# Patient Record
Sex: Male | Born: 1980 | Race: White | Hispanic: No | Marital: Married | Smoking: Former smoker
Health system: Southern US, Community
[De-identification: ages and names within clinical notes are randomized; demographics above are authoritative.]

## PROBLEM LIST (undated history)

## (undated) DIAGNOSIS — T7840XA Allergy, unspecified, initial encounter: Secondary | ICD-10-CM

## (undated) DIAGNOSIS — I10 Essential (primary) hypertension: Secondary | ICD-10-CM

## (undated) DIAGNOSIS — R002 Palpitations: Secondary | ICD-10-CM

## (undated) HISTORY — DX: Allergy, unspecified, initial encounter: T78.40XA

## (undated) HISTORY — DX: Palpitations: R00.2

## (undated) HISTORY — DX: Essential (primary) hypertension: I10

## (undated) HISTORY — PX: WISDOM TOOTH EXTRACTION: SHX21

---

## 2018-07-10 ENCOUNTER — Ambulatory Visit: Payer: Self-pay | Admitting: Cardiology

## 2018-07-15 ENCOUNTER — Telehealth: Payer: Self-pay

## 2018-07-16 ENCOUNTER — Ambulatory Visit: Payer: Self-pay | Admitting: Cardiology

## 2018-07-30 NOTE — Telephone Encounter (Signed)
Pt was called to rescheduled appt and he has called back and done so.

## 2018-08-13 ENCOUNTER — Ambulatory Visit: Payer: 59 | Admitting: Cardiology

## 2018-09-21 ENCOUNTER — Ambulatory Visit: Payer: 59 | Admitting: Cardiology

## 2018-09-21 ENCOUNTER — Other Ambulatory Visit: Payer: Self-pay

## 2018-09-21 ENCOUNTER — Ambulatory Visit: Payer: 59

## 2018-09-21 ENCOUNTER — Encounter: Payer: Self-pay | Admitting: Cardiology

## 2018-09-21 VITALS — BP 145/89 | HR 67 | Temp 97.1°F | Ht 69.0 in | Wt 140.2 lb

## 2018-09-21 DIAGNOSIS — R002 Palpitations: Secondary | ICD-10-CM | POA: Diagnosis not present

## 2018-09-21 DIAGNOSIS — R030 Elevated blood-pressure reading, without diagnosis of hypertension: Secondary | ICD-10-CM | POA: Diagnosis not present

## 2018-09-21 MED ORDER — METOPROLOL TARTRATE 25 MG PO TABS: 25.0000 mg | ORAL_TABLET | Freq: Two times a day (BID) | ORAL | 1 refills | Status: DC

## 2018-09-21 NOTE — Patient Instructions (Signed)
Palpitations  Palpitations are feelings that your heartbeat is not normal. Your heartbeat may feel like it is:   Uneven.   Faster than normal.   Fluttering.   Skipping a beat.  This is usually not a serious problem. In some cases, you may need tests to rule out any serious problems.  Follow these instructions at home:  Pay attention to any changes in your condition. Take these actions to help manage your symptoms:  Eating and drinking   Avoid:  ? Coffee, tea, soft drinks, and energy drinks.  ? Chocolate.  ? Alcohol.  ? Diet pills.  Lifestyle     Try to lower your stress. These things can help you relax:  ? Yoga.  ? Deep breathing and meditation.  ? Exercise.  ? Using words and images to create positive thoughts (guided imagery).  ? Using your mind to control things in your body (biofeedback).   Do not use drugs.   Get plenty of rest and sleep. Keep a regular bed time.  General instructions     Take over-the-counter and prescription medicines only as told by your doctor.   Do not use any products that contain nicotine or tobacco, such as cigarettes and e-cigarettes. If you need help quitting, ask your doctor.   Keep all follow-up visits as told by your doctor. This is important. You may need more tests if palpitations do not go away or get worse.  Contact a doctor if:   Your symptoms last more than 24 hours.   Your symptoms occur more often.  Get help right away if you:   Have chest pain.   Feel short of breath.   Have a very bad headache.   Feel dizzy.   Pass out (faint).  Summary   Palpitations are feelings that your heartbeat is uneven or faster than normal. It may feel like your heart is fluttering or skipping a beat.   Avoid food and drinks that may cause palpitations. These include caffeine, chocolate, and alcohol.   Try to lower your stress. Do not smoke or use drugs.   Get help right away if you faint or have chest pain, shortness of breath, a severe headache, or dizziness.  This  information is not intended to replace advice given to you by your health care provider. Make sure you discuss any questions you have with your health care provider.  Document Released: 01/16/2008 Document Revised: 05/21/2017 Document Reviewed: 05/21/2017  Elsevier Interactive Patient Education  2019 Elsevier Inc.

## 2018-09-21 NOTE — Progress Notes (Signed)
Primary Physician:  Aliene Beams, MD   Patient ID: Jerry Rivera, male    DOB: 1980/08/06, 38 y.o.   MRN: [id number]  Subjective:    Chief Complaint  Patient presents with  . New Patient (Initial Visit)  . Palpitations    HPI: Jerry Rivera  is a 38 y.o. male  with history of palpitations referred to [place] by Dr. Tracie Harrier for evaluation of palpitations.  Reports rapid heart rate since age 31 or 7 and was evaluated in ER at that time that persisted for several hours. States he was given IVF and breathing exercises that helped. He has not had any further workup. He has since had brief episodes of heart racing. Episodes can occur with exertion or resting. States that feels like he has been running a marathon. Sudden onset and offset. If symptoms persist for longer than a minute he will feel nauseous, dizzy, and feel as though he may pass out. Episodes are infrequent. Last episode was 1 week ago, but had been months before this recent episode.  Denis any history of hypertension, hyperlipidemia, diabetes, or thyroid disorders. Maternal grandmother died from MI in late 30's. Former cigar smoker occasionally, but none recently. Does drink a few beers per day. No drug use.  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 He works in [employer].   Past Medical History:  Diagnosis Date  . Allergies   . Palpitations     Past Surgical History:  Procedure Laterality Date  . WISDOM TOOTH EXTRACTION     age 15    Social History   Socioeconomic History  . Marital status: Married    Spouse name: Not on file  . Number of children: 1  . Years of education: Not on file  . Highest education level: Not on file  Occupational History  . Not on file  Social Needs  . Financial resource strain: Not on file  . Food insecurity:    Worry: Not on file    Inability: Not on file  . Transportation needs:    Medical: Not on file    Non-medical: Not on file   Tobacco Use  . Smoking status: Former Smoker    Years: 8.00    Types: Cigars    Last attempt to quit: 09/20/2016    Years since quitting: 2.0  . Smokeless tobacco: Never Used  Substance and Sexual Activity  . Alcohol use: Yes    Comment: beer daily  . Drug use: Not on file  . Sexual activity: Not on file  Lifestyle  . Physical activity:    Days per week: Not on file    Minutes per session: Not on file  . Stress: Not on file  Relationships  . Social connections:    Talks on phone: Not on file    Gets together: Not on file    Attends religious service: Not on file    Active member of club or organization: Not on file    Attends meetings of clubs or organizations: Not on file    Relationship status: Not on file  . Intimate partner violence:    Fear of current or ex partner: Not on file    Emotionally abused: Not on file    Physically abused: Not on file    Forced sexual activity: Not on file  Other Topics Concern  . Not on file  Social History Narrative  . Not on file    Review of Systems  Constitution: Negative for decreased appetite, malaise/fatigue, weight gain and weight loss.  Eyes: Negative for visual disturbance.  Cardiovascular: Negative for chest pain, claudication, dyspnea on exertion, leg swelling, orthopnea, palpitations and syncope.  Respiratory: Negative for hemoptysis and wheezing.   Endocrine: Negative for cold intolerance and heat intolerance.  Hematologic/Lymphatic: Does not bruise/bleed easily.  Skin: Negative for nail changes.  Musculoskeletal: Negative for muscle weakness and myalgias.  Gastrointestinal: Negative for abdominal pain, change in bowel habit, nausea and vomiting.  Neurological: Negative for difficulty with concentration, dizziness, focal weakness and headaches.  Psychiatric/Behavioral: Negative for altered mental status and suicidal ideas.  All other systems reviewed and are negative.     Objective:  Blood pressure (!) 145/89, pulse  67, temperature (!) 97.1 F (36.2 C), height 5\' 9"  (1.753 m), weight 140 lb 3.2 oz (63.6 kg), SpO2 98 %. Body mass index is 20.7 kg/m.    Physical Exam  Constitutional: He is oriented to person, place, and time. Vital signs are normal. He appears well-developed and well-nourished.  HENT:  Head: Normocephalic and atraumatic.  Neck: Normal range of motion.  Cardiovascular: Normal rate, regular rhythm, normal heart sounds and intact distal pulses.  Pulmonary/Chest: Effort normal and breath sounds normal. No accessory muscle usage. No respiratory distress.  Abdominal: Soft. Bowel sounds are normal.  Musculoskeletal: Normal range of motion.  Neurological: He is alert and oriented to person, place, and time.  Skin: Skin is warm and dry.  Vitals reviewed.  Radiology: No results found.  Laboratory examination:    No flowsheet data found. No flowsheet data found. Lipid Panel  No results found for: CHOL, TRIG, HDL, CHOLHDL, VLDL, LDLCALC, LDLDIRECT HEMOGLOBIN A1C No results found for: HGBA1C, MPG TSH No results for input(s): TSH in the last 8760 hours.  PRN Meds:. There are no discontinued medications. Current Meds  Medication Sig  . fluticasone (FLONASE) 50 MCG/ACT nasal spray Place into both nostrils daily.  Marland Kitchen. ibuprofen (ADVIL) 200 MG tablet Take 200 mg by mouth as needed (pain).  Marland Kitchen. loratadine (CLARITIN) 10 MG tablet Take 10 mg by mouth daily.  . Multiple Vitamin (MULTIVITAMIN) tablet Take 1 tablet by mouth daily.    Cardiac Studies:     Assessment:   Palpitations - Plan: EKG 12-Lead, Cardiac event monitor, PCV ECHOCARDIOGRAM COMPLETE  Elevated blood pressure reading in office without diagnosis of hypertension - Plan: PCV ECHOCARDIOGRAM COMPLETE  EKG 09/21/2018: Normal sinus rhythm at 65 bpm, normal axis, early repolarization abnormality, likely normal variant.   Recommendations:   Symptoms are suggestive of SVT. I have educated him on this. Discussed using vagal  maneuvers for termination. I will place on 30 day event monitor for further evaluation. Unsure if he will have an episode; however, had episode last week and will see if we can catch arrhythmia. Physical exam unremarkable. Will schedule for echocardiogram to exclude any structural abnormalities.   Do not feel that he needs stress testing at this time. No chest pain and no significant risk factors. Blood pressure is elevated today, but without history of hypertension. In view of this and palpitations, will start Metoprolol 25 mg BID. Will plan to see him back after the test for follow up and further recommendations. Patient was educated on valsalva maneuvers to help with palpitations.    *I have discussed this case with Dr. Rosemary HolmsPatwardhan and he personally examined the patient and participated in formulating the plan.*   Toniann FailAshton Haynes Dublin Cantero, MSN, APRN, FNP-C [facility] [place] Office: [phone] Fax: [phone]

## 2018-10-09 ENCOUNTER — Ambulatory Visit (INDEPENDENT_AMBULATORY_CARE_PROVIDER_SITE_OTHER): Payer: 59

## 2018-10-09 ENCOUNTER — Other Ambulatory Visit: Payer: Self-pay

## 2018-10-09 DIAGNOSIS — R002 Palpitations: Secondary | ICD-10-CM

## 2018-10-09 DIAGNOSIS — R030 Elevated blood-pressure reading, without diagnosis of hypertension: Secondary | ICD-10-CM | POA: Diagnosis not present

## 2018-10-16 NOTE — Progress Notes (Signed)
S/w pt advised him normal echo.

## 2018-11-05 ENCOUNTER — Ambulatory Visit (INDEPENDENT_AMBULATORY_CARE_PROVIDER_SITE_OTHER): Payer: 59 | Admitting: Cardiology

## 2018-11-05 ENCOUNTER — Encounter: Payer: Self-pay | Admitting: Cardiology

## 2018-11-05 ENCOUNTER — Other Ambulatory Visit: Payer: Self-pay

## 2018-11-05 VITALS — Ht 69.0 in | Wt 140.0 lb

## 2018-11-05 DIAGNOSIS — R030 Elevated blood-pressure reading, without diagnosis of hypertension: Secondary | ICD-10-CM

## 2018-11-05 DIAGNOSIS — R002 Palpitations: Secondary | ICD-10-CM | POA: Diagnosis not present

## 2018-11-05 MED ORDER — METOPROLOL SUCCINATE ER 25 MG PO TB24: 25.0000 mg | ORAL_TABLET | Freq: Every day | ORAL | 3 refills | Status: DC

## 2018-11-05 NOTE — Progress Notes (Signed)
Primary Physician:  Aliene BeamsHagler, Rachel, MD   Patient ID: Jerry Rivera, male    DOB: 1981/04/05, 38 y.o.   MRN: [id number]  Subjective:    Chief Complaint  Patient presents with   Palpitations   Follow-up    This visit type was conducted due to national recommendations for restrictions regarding the COVID-19 Pandemic (e.g. social distancing).  This format is felt to be most appropriate for this patient at this time.  All issues noted in this document were discussed and addressed.  No physical exam was performed (except for noted visual exam findings with Telehealth visits).  The patient has consented to conduct a Telehealth visit and understands insurance will be billed.   I discussed the limitations of evaluation and management by telemedicine and the availability of in person appointments. The patient expressed understanding and agreed to proceed.  Virtual Visit via Video Note is as below  I connected with Jerry Rivera, on 11/05/18 at 1338 by a video enabled telemedicine application and verified that I am speaking with the correct person using two identifiers.     I have discussed with her regarding the safety during COVID Pandemic and steps and precautions including social distancing with the patient.    HPI: Jerry Rivera  is a 38 y.o. male  with history of palpitations recently evaluated by us for palpitations.   Patient had had episodes of rapid heart rate since around age 38 or [age] that occurred sporadically with only a few episodes lasting for a few hours.  His symptoms were concerning for SVT.  Although his episodes were infrequent, it was felt worthwhile to place him on 30-day event monitor and also undergo echocardiogram.  Blood pressure was slightly elevated in our office, he was started on metoprolol tartrate.  He now presents for follow-up.  He reports not having any similar episode of palpitations while wearing the monitor, but on 1 of the first days he had an episode  that he thought he was about to have symptoms when it suddenly resolved.  He is tolerating metoprolol well.  He is not been checking his blood pressure at home as he did not have a way to do this.  He reports his episodes of palpitations resolved with using vagal maneuvers and there is a sudden onset and offset.  Has associated dizziness and near syncope.   He is without history of hypertension, hyperlipidemia, diabetes, or thyroid disorders. Maternal grandmother died from MI in late 6350's. Former cigar smoker occasionally, but none recently. Does drink a few beers per day. No drug use.  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   Past Medical History:  Diagnosis Date   Allergies    Palpitations     Past Surgical History:  Procedure Laterality Date   WISDOM TOOTH EXTRACTION     age 38    Social History   Socioeconomic History   Marital status: Married    Spouse name: Not on file   Number of children: 1   Years of education: Not on file   Highest education level: Not on file  Occupational History   Not on file  Social Needs   Financial resource strain: Not on file   Food insecurity    Worry: Not on file    Inability: Not on file   Transportation needs    Medical: Not on file    Non-medical: Not on file  Tobacco Use  Smoking status: Light Tobacco Smoker    Years: 8.00    Types: Cigars    Last attempt to quit: 09/20/2016    Years since quitting: 2.1   Smokeless tobacco: Never Used   Tobacco comment: once twice a years  Substance and Sexual Activity   Alcohol use: Yes    Comment: beer daily   Drug use: Not on file   Sexual activity: Not on file  Lifestyle   Physical activity    Days per week: Not on file    Minutes per session: Not on file   Stress: Not on file  Relationships   Social connections    Talks on phone: Not on file    Gets together: Not on file    Attends religious  service: Not on file    Active member of club or organization: Not on file    Attends meetings of clubs or organizations: Not on file    Relationship status: Not on file   Intimate partner violence    Fear of current or ex partner: Not on file    Emotionally abused: Not on file    Physically abused: Not on file    Forced sexual activity: Not on file  Other Topics Concern   Not on file  Social History Narrative   Not on file    Review of Systems  Constitution: Negative for decreased appetite, malaise/fatigue, weight gain and weight loss.  Eyes: Negative for visual disturbance.  Cardiovascular: Negative for chest pain, claudication, dyspnea on exertion, leg swelling, orthopnea, palpitations and syncope.  Respiratory: Negative for hemoptysis and wheezing.   Endocrine: Negative for cold intolerance and heat intolerance.  Hematologic/Lymphatic: Does not bruise/bleed easily.  Skin: Negative for nail changes.  Musculoskeletal: Negative for muscle weakness and myalgias.  Gastrointestinal: Negative for abdominal pain, change in bowel habit, nausea and vomiting.  Neurological: Negative for difficulty with concentration, dizziness, focal weakness and headaches.  Psychiatric/Behavioral: Negative for altered mental status and suicidal ideas.  All other systems reviewed and are negative.     Objective:  Height 5\' 9"  (1.753 m), weight 140 lb (63.5 kg). Body mass index is 20.67 kg/m.    Physical exam not performed or limited due to virtual visit.  Patient appeared to be in no distress, Neck was supple, respiration was not labored.  Please see exam details from prior visit is as below.   Physical Exam  Constitutional: He is oriented to person, place, and time. Vital signs are normal. He appears well-developed and well-nourished.  HENT:  Head: Normocephalic and atraumatic.  Neck: Normal range of motion.  Cardiovascular: Normal rate, regular rhythm, normal heart sounds and intact distal  pulses.  Pulmonary/Chest: Effort normal and breath sounds normal. No accessory muscle usage. No respiratory distress.  Abdominal: Soft. Bowel sounds are normal.  Musculoskeletal: Normal range of motion.  Neurological: He is alert and oriented to person, place, and time.  Skin: Skin is warm and dry.  Vitals reviewed.  Radiology: No results found.  Laboratory examination:    No flowsheet data found. No flowsheet data found. Lipid Panel  No results found for: CHOL, TRIG, HDL, CHOLHDL, VLDL, LDLCALC, LDLDIRECT HEMOGLOBIN A1C No results found for: HGBA1C, MPG TSH No results for input(s): TSH in the last 8760 hours.  PRN Meds:. Medications Discontinued During This Encounter  Medication Reason   metoprolol tartrate (LOPRESSOR) 25 MG tablet Discontinued by provider   Current Meds  Medication Sig   fluticasone (FLONASE) 50 MCG/ACT nasal spray Place  into both nostrils daily as needed.   ibuprofen (ADVIL) 200 MG tablet Take 200 mg by mouth as needed (pain).   loratadine (CLARITIN) 10 MG tablet Take 10 mg by mouth as needed.   Multiple Vitamin (MULTIVITAMIN) tablet Take 1 tablet by mouth daily.   [DISCONTINUED] metoprolol tartrate (LOPRESSOR) 25 MG tablet Take 1 tablet (25 mg total) by mouth 2 (two) times daily.    Cardiac Studies:   30 day event monitor 06/01-06/30/2020: Normal sinus rhythm. 2 patient triggered events occurred without reported symptoms correlating with sinus rhythm. 1 auto detected event for sinus tachycardia at 163 bpm on 06/20 at 1425. No SVT or A fib was noted.   Echocardiogram 10/09/2018 :  Normal LV systolic function with EF 55%. Left ventricle cavity is normal in size. Normal global wall motion. Normal diastolic filling pattern. Calculated EF 55%. IVC is dilated with respiratory variation. Probably normal variant in a young individual. Normal echocardiogram.  Assessment:     ICD-10-CM   1. Palpitations  R00.2   2. Elevated blood pressure reading in  office without diagnosis of hypertension  R03.0     EKG 09/21/2018: Normal sinus rhythm at 65 bpm, normal axis, early repolarization abnormality, likely normal variant.   Recommendations:   I discussed recently obtained echocardiogram and event monitoring report with the patient, he was reassured.  He did not have any episodes of palpitations like he has had in the past while wearing the monitor.  He did have one episode in which she triggered an event on day 6 that he felt he may be beginning to have an episode, but did not actually occur.  No arrhythmias were noted at that time.  Sinus tachycardia that was noted on event monitor, patient reports was likely during exercise.  No structural abnormalities were noted on echocardiogram.  I continue to feel that his episodes of palpitations are fairly consistent with SVT.  He is able to terminate these with vagal maneuvers in the past.  Will recommend continued watchful waiting, if he has worsening or increased frequency of episodes can consider replacing the monitor.  I discussed life altering versus life-threatening.  I would recommend that he continue with metoprolol to help with his palpitations and also previously noted elevated blood pressure.  He did not have a way to check his blood pressure today, but he will obtain this to start regular monitoring.  I will switch metoprolol tartrate to metoprolol succinate for ease.  I will plan to see him back in 1 year from now to follow-up on his palpitations.  I have urged him that should he again have palpitations, he should try to be evaluated in urgent care or physician's office with EKG to catch his arrhythmia for confirmation.  Also discussed obtaining apple watch with EKG capabilities that may help with diagnosis as well.   Miquel Dunn, MSN, APRN, FNP-C [facility] [place] Office: [phone] Fax: [phone]

## 2019-04-28 ENCOUNTER — Other Ambulatory Visit: Payer: Self-pay | Admitting: Cardiology

## 2019-05-24 ENCOUNTER — Other Ambulatory Visit: Payer: Self-pay | Admitting: Cardiology

## 2019-06-24 ENCOUNTER — Ambulatory Visit: Payer: 59 | Admitting: Family Medicine

## 2019-06-24 ENCOUNTER — Other Ambulatory Visit: Payer: Self-pay

## 2019-06-24 ENCOUNTER — Encounter: Payer: Self-pay | Admitting: Family Medicine

## 2019-06-24 VITALS — BP 140/90 | HR 66 | Temp 96.6°F | Ht 70.0 in | Wt 141.0 lb

## 2019-06-24 DIAGNOSIS — K219 Gastro-esophageal reflux disease without esophagitis: Secondary | ICD-10-CM | POA: Diagnosis not present

## 2019-06-24 DIAGNOSIS — R05 Cough: Secondary | ICD-10-CM | POA: Diagnosis not present

## 2019-06-24 DIAGNOSIS — R0982 Postnasal drip: Secondary | ICD-10-CM | POA: Diagnosis not present

## 2019-06-24 DIAGNOSIS — Z87898 Personal history of other specified conditions: Secondary | ICD-10-CM | POA: Insufficient documentation

## 2019-06-24 DIAGNOSIS — R1013 Epigastric pain: Secondary | ICD-10-CM | POA: Diagnosis not present

## 2019-06-24 DIAGNOSIS — R0981 Nasal congestion: Secondary | ICD-10-CM

## 2019-06-24 DIAGNOSIS — R053 Chronic cough: Secondary | ICD-10-CM

## 2019-06-24 NOTE — Progress Notes (Signed)
Subjective:    Patient ID: Jerry Rivera, male    DOB: 08-Dec-1980, 39 y.o.   MRN: [id number]  HPI Chief Complaint  Patient presents with  . new pt    new pt get established, issues- 3 years postnasal drainage, cough- lots of phelgm in the morning and throughout day.  last week had severe heartburn, no relief with otc. mild chest pain that is moving all around body,  on prilosec otc 14 day treatment, on day 9   He is new to the practice and here to establish care.  Previous medical care: [facility] - Dr. Tracie Harrier   Other providers: Cardiologist- [facility]  GI- Dr. Bosie Clos   Complains of post nasal drainage, thick mucus and cough for the past 3 years. Cough is quite productive in the mornings. States he clears his throat a lot during the day.  States he also has chronic nasal congestion which he uses an OTC nasal steroid for as needed.   States he has tried Claritin and just recently stopped since it was not helping. No regular allergy symptoms such as itchy, watery eyes, sneezing, ear discomfort.   Complains of gradually improving heartburn over the past 10 days. Epigastric burning. He ate spicy [place] food the day prior.  Sitting up improved his symptoms as well as taking an antacid.   States he is now is doing a 14 day trial of Prilosec 20 mg.  He eats and lays down regularly. States he eats fast.  Drinks alcohol daily, 1-2 beers per day. Brews his own beer. Low alcohol type beer.  Takes NSAIDs sporadically.  States last week he had pain with bending over and sleeping but now these positions do not cause him discomfort.   States he has not been getting regular exercise until yesterday and he felt fine jogging. No longer having chest/epigastric pain   Hx of palpitations- benign - Taking metoprolol daily. Palpitations are very infrequent now.   Denies smoking, drug use.   Mother with colon cancer so he will have his first colonoscopy at age 54 per GI.   Social  history: Lives with wife and son who is 35 years old, degree Research scientist (life sciences).   Speaks a second language- [place]     Reviewed allergies, medications, past medical, surgical, family, and social history.    Review of Systems Pertinent positives and negatives in the history of present illness.     Objective:   Physical Exam BP 140/90   Pulse 66   Temp (!) 96.6 F (35.9 C)   Ht 5\' 10"  (1.778 m)   Wt 141 lb (64 kg)   SpO2 98%   BMI 20.23 kg/m   Alert and in no distress. Tympanic membranes and canals are normal. Bilateral nares are inflamed, left nare is narrow, right nare with ?polyp. Neck is supple without adenopathy or thyromegaly. Cardiac exam shows a regular sinus rhythm without murmurs or gallops. Lungs are clear to auscultation. Abdomen is soft, non distended, normal BS, non tender, no guarding or rebound, no palpable masses. Skin is warm and dry.        Assessment & Plan:  Gastroesophageal reflux disease, unspecified whether esophagitis present  Epigastric pain - Plan: CBC with Differential/Platelet, Comprehensive metabolic panel, Lipase, Amylase  Post-nasal drainage - Plan: Ambulatory referral to ENT  Chronic cough - Plan: Ambulatory referral to ENT  Chronic nasal congestion - Plan: Ambulatory referral to ENT  History of palpitations  Is a pleasant 39 year old male who is new  to the practice. Symptoms consistent with GERD and improving with Prilosec and avoiding spicy food.  In-depth counseling on GERD and lifestyle modifications including avoiding foods or beverages that trigger symptoms, avoiding eating and laying down which he has been doing quite a lot, and avoid overeating or eating fast. He will continue on Prilosec for now and follow-up if his symptoms are worsening or not back to baseline with these modifications. Long history of postnasal drainage and chronic cough.  Symptoms have not resolved with allergy medication nor with reflux medication.   Abnormal nasal passages.  I will refer to ENT for further evaluation. Palpitations are very infrequent now.  He has been cleared by cardiology in the past.  He continues on metoprolol Follow-up pending lab results and in 4 weeks for GERD follow-up as well as a CPE.

## 2019-06-25 LAB — CBC WITH DIFFERENTIAL/PLATELET
Basophils Absolute: 0 10*3/uL (ref 0.0–0.2)
Basos: 0 %
EOS (ABSOLUTE): 0 10*3/uL (ref 0.0–0.4)
Eos: 0 %
Hematocrit: 45.5 % (ref 37.5–51.0)
Hemoglobin: 15.6 g/dL (ref 13.0–17.7)
Immature Grans (Abs): 0 10*3/uL (ref 0.0–0.1)
Immature Granulocytes: 1 %
Lymphocytes Absolute: 1.4 10*3/uL (ref 0.7–3.1)
Lymphs: 19 %
MCH: 31.4 pg (ref 26.6–33.0)
MCHC: 34.3 g/dL (ref 31.5–35.7)
MCV: 92 fL (ref 79–97)
Monocytes Absolute: 0.5 10*3/uL (ref 0.1–0.9)
Monocytes: 8 %
Neutrophils Absolute: 5.1 10*3/uL (ref 1.4–7.0)
Neutrophils: 72 %
Platelets: 218 10*3/uL (ref 150–450)
RBC: 4.97 x10E6/uL (ref 4.14–5.80)
RDW: 11.7 % (ref 11.6–15.4)
WBC: 7.2 10*3/uL (ref 3.4–10.8)

## 2019-06-25 LAB — LIPASE: Lipase: 26 U/L (ref 13–78)

## 2019-06-25 LAB — COMPREHENSIVE METABOLIC PANEL
ALT: 16 IU/L (ref 0–44)
AST: 19 IU/L (ref 0–40)
Albumin/Globulin Ratio: 1.9 (ref 1.2–2.2)
Albumin: 4.6 g/dL (ref 4.0–5.0)
Alkaline Phosphatase: 69 IU/L (ref 39–117)
BUN/Creatinine Ratio: 12 (ref 9–20)
BUN: 11 mg/dL (ref 6–20)
Bilirubin Total: 0.3 mg/dL (ref 0.0–1.2)
CO2: 23 mmol/L (ref 20–29)
Calcium: 9.5 mg/dL (ref 8.7–10.2)
Chloride: 104 mmol/L (ref 96–106)
Creatinine, Ser: 0.91 mg/dL (ref 0.76–1.27)
GFR calc Af Amer: 122 mL/min/{1.73_m2} (ref >59)
GFR calc non Af Amer: 106 mL/min/{1.73_m2} (ref >59)
Globulin, Total: 2.4 g/dL (ref 1.5–4.5)
Glucose: 108 mg/dL — ABNORMAL HIGH (ref 65–99)
Potassium: 4.7 mmol/L (ref 3.5–5.2)
Sodium: 143 mmol/L (ref 134–144)
Total Protein: 7 g/dL (ref 6.0–8.5)

## 2019-06-25 LAB — AMYLASE: Amylase: 60 U/L (ref 31–110)

## 2019-06-25 NOTE — Progress Notes (Signed)
His labs are fine as long as he was not fasting. If he was fasting then we should check a Hgb A1c to screen for diabetes since his blood glucose is elevated.

## 2019-07-05 ENCOUNTER — Other Ambulatory Visit: Payer: Self-pay

## 2019-07-05 ENCOUNTER — Encounter (INDEPENDENT_AMBULATORY_CARE_PROVIDER_SITE_OTHER): Payer: Self-pay | Admitting: Otolaryngology

## 2019-07-05 ENCOUNTER — Ambulatory Visit (INDEPENDENT_AMBULATORY_CARE_PROVIDER_SITE_OTHER): Payer: 59 | Admitting: Otolaryngology

## 2019-07-05 VITALS — Temp 98.2°F

## 2019-07-05 DIAGNOSIS — J342 Deviated nasal septum: Secondary | ICD-10-CM | POA: Diagnosis not present

## 2019-07-05 DIAGNOSIS — J310 Chronic rhinitis: Secondary | ICD-10-CM | POA: Diagnosis not present

## 2019-07-05 NOTE — Progress Notes (Signed)
HPI: Jerry Rivera is a 38 y.o. male who presents is referred by his PCP for evaluation of sinuses.  He complains of chronic postnasal drainage which is worse in the mornings.  He describes a thick phlegm that he has to cough up frequently.  He complains of frequent throat clearing and coughing.  He is also chronically congested more on the left side. Patient does have history of GERD and has used Prilosec which seems to help. He does not really complain of blowing much mucus out of his nose more of just postnasal drainage which is worse in the mornings.  He does complain of that much trouble breathing through his nose although he always is more congested on the left side. He has been using Flonase 1 spray every morning.  There is a question about a possible polyp in his nose.Marland Kitchen  Past Medical History:  Diagnosis Date  . Allergies   . Palpitations    Past Surgical History:  Procedure Laterality Date  . WISDOM TOOTH EXTRACTION     age 70   Social History   Socioeconomic History  . Marital status: Married    Spouse name: Not on file  . Number of children: 1  . Years of education: Not on file  . Highest education level: Not on file  Occupational History  . Not on file  Tobacco Use  . Smoking status: Light Tobacco Smoker    Years: 8.00    Types: Cigars    Start date: 2010    Last attempt to quit: 09/20/2016    Years since quitting: 2.7  . Smokeless tobacco: Never Used  . Tobacco comment: once twice a years  Substance and Sexual Activity  . Alcohol use: Yes    Comment: 10-14 beers weekly  . Drug use: Not on file  . Sexual activity: Not on file  Other Topics Concern  . Not on file  Social History Narrative  . Not on file   Social Determinants of Health   Financial Resource Strain:   . Difficulty of Paying Living Expenses:   Food Insecurity:   . Worried About Charity fundraiser in the Last Year:   . Arboriculturist in the Last Year:   Transportation Needs:   . Lexicographer (Medical):   Marland Kitchen Lack of Transportation (Non-Medical):   Physical Activity:   . Days of Exercise per Week:   . Minutes of Exercise per Session:   Stress:   . Feeling of Stress :   Social Connections:   . Frequency of Communication with Friends and Family:   . Frequency of Social Gatherings with Friends and Family:   . Attends Religious Services:   . Active Member of Clubs or Organizations:   . Attends Archivist Meetings:   Marland Kitchen Marital Status:    Family History  Problem Relation Age of Onset  . Colon cancer Mother   . Stomach cancer Mother   . Hypertension Father    No Known Allergies Prior to Admission medications   Medication Sig Start Date End Date Taking? Authorizing Provider  fluticasone (FLONASE) 50 MCG/ACT nasal spray Place into both nostrils daily as needed.   Yes [provider]  ibuprofen (ADVIL) 200 MG tablet Take 200 mg by mouth as needed (pain).   Yes [provider]  metoprolol succinate (TOPROL-XL) 25 MG 24 hr tablet Take 1 tablet (25 mg total) by mouth daily. Take with or immediately following a meal. 11/05/18  Yes Toniann Fail, NP  Multiple Vitamin (MULTIVITAMIN) tablet Take 1 tablet by mouth daily.   Yes [provider]  Omeprazole Magnesium (PRILOSEC OTC PO) Take by mouth.   Yes [provider]     Positive ROS: Otherwise negative  All other systems have been reviewed and were otherwise negative with the exception of those mentioned in the HPI and as above.  Physical Exam: Constitutional: Alert, well-appearing, no acute distress Ears: External ears without lesions or tenderness. Ear canals are clear bilaterally with intact, clear TMs bilaterally. Nasal: External nose without lesions. Septum moderately deviated anteriorly to the left.  Nasal endoscopy was performed bilaterally and this demonstrated clear middle meatus bilaterally.  Mild edema with clear mucus discharge.  Posterior ethmoid and  sphenoid region was clear with no active mucopurulent discharge.  No polyps noted.  Nasopharynx was clear. Oral: Lips and gums without lesions. Tongue and palate mucosa without lesions. Posterior oropharynx clear. Neck: No palpable adenopathy or masses Respiratory: Breathing comfortably  Skin: No facial/neck lesions or rash noted.  Nasal/sinus endoscopy  Date/Time: 07/05/2019 5:42 PM Performed by: Jerry Halon, MD Authorized by: Jerry Halon, MD   Consent:    Consent obtained:  Verbal   Consent given by:  Patient   Risks discussed:  Pain Procedure details:    Indications: sino-nasal symptoms     Medication:  Afrin   Instrument: flexible fiberoptic nasal endoscope     Scope location: bilateral nare   Septum:    Deviation: deviated to the left     Severity of deviation: intermediate   Sinus:    Right middle meatus: normal     Left middle meatus: normal     Right nasopharynx: normal     Left nasopharynx: normal   Comments:     On nasal endoscopy both middle meatus regions were clear with no active mucopurulent discharge noted.  Posterior ethmoid and sphenoid region were clear with no active drainage noted.  The mucus within the nasal cavity was clear.    Assessment: Moderate anterior septal deviation to the left.  Clinically no signs of active infection. Chronic rhinitis with postnasal drainage. History of GE reflux disease.  Plan: Suggested regular use of either Nasacort or Flonase 2 sprays each nostril at night.  I also prescribed azelastine 1 spray twice daily and discussed use of saline irrigation during the day as needed. Also gave him samples of Mucinex and Mucinex DM to try as needed. If he continues to have chronic problems he might benefit from septoplasty and turbinate reductions but would recommend medical therapy initially.   Jerry Bonds, MD   CC:

## 2019-07-08 ENCOUNTER — Telehealth: Payer: Self-pay | Admitting: Family Medicine

## 2019-07-08 NOTE — Telephone Encounter (Signed)
Requested medical records received from [facility]

## 2019-07-19 ENCOUNTER — Encounter: Payer: Self-pay | Admitting: Family Medicine

## 2019-07-28 NOTE — Progress Notes (Signed)
Subjective:    Patient ID: Jerry Rivera, male    DOB: April 21, 1981, 39 y.o.   MRN: [id number]  HPI Chief Complaint  Patient presents with  . fasting cpe    fasting cpe, no other concerns   He is here for a complete physical exam. Previous medical care: [facility]  Last CPE: March 2020   Other providers: Cardiologist- [facility]  GI- Dr. Manuella Ghazi [place] - eyes   States he is no longer having issues with GERD. Symptoms resolved.   He saw Dr. Lucia Gaskins and is considering having nasal surgery.  Reports chronic nasal drainage, post nasal drainage and itchy eyes. He is using Flonase and has tried Mucinex per Dr. Pollie Friar advice.  He is not currently taking an antihistamine.    Social history: Lives with wife and son who is 38 years old, degree Lawyer.   Speaks a second language- [place]   Smokes 1-2 cigars per year. No drug use.  Drinks 2 beers daily.  Diet: fairly healthy. Well balanced  1-2 cups of coffee daily. Tea in afternoon. Caffeine free soda  Exercise: jogs 2-3 times per week. Fencing as a sport   Immunizations: his first Covid vaccine one week ago. Tdap 2012. Vaccines updated in 2012 before trip to [place].   Health maintenance:  Colonoscopy: will have this at 39 due to family history.  Last Dental Exam: December 2020  Last Eye Exam: last month. Contact lenses and glasses   Wears seatbelt always, uses sunscreen, smoke detectors in home and functioning, does not text while driving, feels safe in home environment.  Reviewed allergies, medications, past medical, surgical, family, and social history.   Review of Systems Review of Systems Constitutional: -fever, -chills, -sweats, -unexpected weight change,-fatigue ENT: +runny nose, -ear pain, -sore throat Cardiology:  -chest pain, +palpitations, -edema Respiratory: -cough, -shortness of breath, -wheezing Gastroenterology: -abdominal pain, -nausea, -vomiting, -diarrhea,  -constipation  Hematology: -bleeding or bruising problems Musculoskeletal: -arthralgias, -myalgias, -joint swelling, -back pain Ophthalmology: -vision changes Urology: -dysuria, -difficulty urinating, -hematuria, -urinary frequency, -urgency Neurology: -headache, -weakness, -tingling, -numbness       Objective:   Physical Exam BP 124/76   Pulse (!) 55   Temp 98.6 F (37 C)   Ht 5\' 10"  (1.778 m)   Wt 140 lb (63.5 kg)   BMI 20.09 kg/m   General Appearance:    Alert, cooperative, no distress, appears stated age  Head:    Normocephalic, without obvious abnormality, atraumatic  Eyes:    PERRL, conjunctiva/corneas clear, EOM's intact  Ears:    Normal TM's and external ear canals  Nose:   Mask in place   Throat:   Mask in place   Neck:   Supple, no lymphadenopathy;  thyroid:  no   enlargement/tenderness/nodules; no JVD  Back:    Spine nontender, no curvature, ROM normal, no CVA     tenderness  Lungs:     Clear to auscultation bilaterally without wheezes, rales or     ronchi; respirations unlabored  Chest Wall:    No tenderness or deformity   Heart:    Regular rate and rhythm, S1 and S2 normal, no murmur, rub   or gallop  Breast Exam:    No chest wall tenderness, masses or gynecomastia  Abdomen:     Soft, non-tender, nondistended, normoactive bowel sounds,    no masses, no hepatosplenomegaly  Genitalia:    Normal male external genitalia without lesions. Piercing present.  Testicles without masses.  No inguinal  hernias.  Chaperone present  Rectal:   Deferred due to age <40 and lack of symptoms  Extremities:   No clubbing, cyanosis or edema  Pulses:   2+ and symmetric all extremities  Skin:   Skin color, texture, turgor normal, no rashes or lesions  Lymph nodes:   Cervical, supraclavicular, and axillary nodes normal  Neurologic:   CNII-XII intact, normal strength, sensation and gait; reflexes 2+ and symmetric throughout          Psych:   Normal mood, affect, hygiene and grooming.         Assessment & Plan:  Routine general medical examination at a health care facility - Plan: Lipid panel -Is a pleasant 39 year old male who is fairly new to me.  He is here today for fasting CPE. Preventive healthcare discussed.  He does self testicular exams. Immunizations reviewed.  He did have all of his immunizations updated approximately 9 years ago when he traveled to [place].  He just received his first Covid vaccine and is aware that he should not have any other vaccines within 2 weeks of these. Counseling on healthy lifestyle including diet, exercise and recommendations for alcohol use. Discussed safety and health promotion. Continue seeing cardiology as needed for palpitations.  Doing well on metoprolol.  Gastroesophageal reflux disease, unspecified whether esophagitis present -Reflux is no longer an issue for him.  I did recommend that if he has another severe flareup as he did last month, he should follow-up with his GI.  Screening for lipid disorders - Plan: Lipid panel  Environmental and seasonal allergies -Reviewed Dr. Allene Pyo note regarding deviated septum and URI symptoms.  I recommend that he continue with Dr. Allene Pyo treatment recommendation as well as adding nondrowsy antihistamine.  He would consider seeing an allergist in the future if symptoms do not improve.

## 2019-07-28 NOTE — Patient Instructions (Addendum)
Try taking an over the counter antihistamine such as Xyzal, Zyrtec, Claritin or Allegra daily.   Continue with Flonase. Mucinex if this is helping.     Preventive Care 12-40 Years Old, Male Preventive care refers to lifestyle choices and visits with your health care provider that can promote health and wellness. This includes:  A yearly physical exam. This is also called an annual well check.  Regular dental and eye exams.  Immunizations.  Screening for certain conditions.  Healthy lifestyle choices, such as eating a healthy diet, getting regular exercise, not using drugs or products that contain nicotine and tobacco, and limiting alcohol use. What can I expect for my preventive care visit? Physical exam Your health care provider will check:  Height and weight. These may be used to calculate body mass index (BMI), which is a measurement that tells if you are at a healthy weight.  Heart rate and blood pressure.  Your skin for abnormal spots. Counseling Your health care provider may ask you questions about:  Alcohol, tobacco, and drug use.  Emotional well-being.  Home and relationship well-being.  Sexual activity.  Eating habits.  Work and work Statistician. What immunizations do I need?  Influenza (flu) vaccine  This is recommended every year. Tetanus, diphtheria, and pertussis (Tdap) vaccine  You may need a Td booster every 10 years. Varicella (chickenpox) vaccine  You may need this vaccine if you have not already been vaccinated. Human papillomavirus (HPV) vaccine  If recommended by your health care provider, you may need three doses over 6 months. Measles, mumps, and rubella (MMR) vaccine  You may need at least one dose of MMR. You may also need a second dose. Meningococcal conjugate (MenACWY) vaccine  One dose is recommended if you are 16-43 years old and a Market researcher living in a residence hall, or if you have one of several medical  conditions. You may also need additional booster doses. Pneumococcal conjugate (PCV13) vaccine  You may need this if you have certain conditions and were not previously vaccinated. Pneumococcal polysaccharide (PPSV23) vaccine  You may need one or two doses if you smoke cigarettes or if you have certain conditions. Hepatitis A vaccine  You may need this if you have certain conditions or if you travel or work in places where you may be exposed to hepatitis A. Hepatitis B vaccine  You may need this if you have certain conditions or if you travel or work in places where you may be exposed to hepatitis B. Haemophilus influenzae type b (Hib) vaccine  You may need this if you have certain risk factors. You may receive vaccines as individual doses or as more than one vaccine together in one shot (combination vaccines). Talk with your health care provider about the risks and benefits of combination vaccines. What tests do I need? Blood tests  Lipid and cholesterol levels. These may be checked every 5 years starting at age 35.  Hepatitis C test.  Hepatitis B test. Screening   Diabetes screening. This is done by checking your blood sugar (glucose) after you have not eaten for a while (fasting).  Sexually transmitted disease (STD) testing. Talk with your health care provider about your test results, treatment options, and if necessary, the need for more tests. Follow these instructions at home: Eating and drinking   Eat a diet that includes fresh fruits and vegetables, whole grains, lean protein, and low-fat dairy products.  Take vitamin and mineral supplements as recommended by your health care  provider.  Do not drink alcohol if your health care provider tells you not to drink.  If you drink alcohol: ? Limit how much you have to 0-2 drinks a day. ? Be aware of how much alcohol is in your drink. In the [place] one drink equals one 12 oz bottle of beer (355 mL), one 5 oz glass of wine  (148 mL), or one 1 oz glass of hard liquor (44 mL). Lifestyle  Take daily care of your teeth and gums.  Stay active. Exercise for at least 30 minutes on 5 or more days each week.  Do not use any products that contain nicotine or tobacco, such as cigarettes, e-cigarettes, and chewing tobacco. If you need help quitting, ask your health care provider.  If you are sexually active, practice safe sex. Use a condom or other form of protection to prevent STIs (sexually transmitted infections). What's next?  Go to your health care provider once a year for a well check visit.  Ask your health care provider how often you should have your eyes and teeth checked.  Stay up to date on all vaccines. This information is not intended to replace advice given to you by your health care provider. Make sure you discuss any questions you have with your health care provider. Document Revised: 04/02/2018 Document Reviewed: 04/02/2018 Elsevier Patient Education  2020 [employer].

## 2019-07-29 ENCOUNTER — Other Ambulatory Visit: Payer: Self-pay

## 2019-07-29 ENCOUNTER — Ambulatory Visit: Payer: 59 | Admitting: Family Medicine

## 2019-07-29 ENCOUNTER — Encounter: Payer: Self-pay | Admitting: Family Medicine

## 2019-07-29 VITALS — BP 124/76 | HR 55 | Temp 98.6°F | Ht 70.0 in | Wt 140.0 lb

## 2019-07-29 DIAGNOSIS — K219 Gastro-esophageal reflux disease without esophagitis: Secondary | ICD-10-CM

## 2019-07-29 DIAGNOSIS — Z0000 Encounter for general adult medical examination without abnormal findings: Secondary | ICD-10-CM | POA: Diagnosis not present

## 2019-07-29 DIAGNOSIS — Z13220 Encounter for screening for lipoid disorders: Secondary | ICD-10-CM

## 2019-07-29 DIAGNOSIS — J3089 Other allergic rhinitis: Secondary | ICD-10-CM | POA: Diagnosis not present

## 2019-07-29 LAB — LIPID PANEL
Chol/HDL Ratio: 3.4 ratio (ref 0.0–5.0)
Cholesterol, Total: 240 mg/dL — ABNORMAL HIGH (ref 100–199)
HDL: 70 mg/dL (ref >39)
LDL Chol Calc (NIH): 158 mg/dL — ABNORMAL HIGH (ref 0–99)
Triglycerides: 72 mg/dL (ref 0–149)
VLDL Cholesterol Cal: 12 mg/dL (ref 5–40)

## 2019-07-30 NOTE — Progress Notes (Signed)
His LDL or bad cholesterol is elevated which is a risk factor for heart disease down the road.  Fortunately his HDL or good cholesterol is in a very good range.  This is some protection.  His triglycerides are normal.  I do recommend that he pay close attention to his diet and limit fatty foods, fried foods and make sure he is getting at least 150 minutes of some sort of vigorous physical activity each week.

## 2019-08-17 ENCOUNTER — Other Ambulatory Visit: Payer: Self-pay | Admitting: Cardiology

## 2019-11-05 ENCOUNTER — Ambulatory Visit: Payer: 59 | Admitting: Cardiology

## 2020-06-21 ENCOUNTER — Telehealth: Payer: Self-pay | Admitting: Family Medicine

## 2020-06-21 NOTE — Telephone Encounter (Signed)
Pt is coming in march the 22 states he has chronic cough and post nasal drip states It has been going on for over a year is it ok for him to come in he wants to discuss surgery options and exercise states he has had this since last year and he has no symptoms and has had all 3 vaccines

## 2020-06-21 NOTE — Telephone Encounter (Signed)
I am with this as long as no new symptoms. Thanks.

## 2020-07-11 ENCOUNTER — Encounter: Payer: Self-pay | Admitting: Family Medicine

## 2020-07-11 ENCOUNTER — Ambulatory Visit (INDEPENDENT_AMBULATORY_CARE_PROVIDER_SITE_OTHER): Payer: 59 | Admitting: Family Medicine

## 2020-07-11 ENCOUNTER — Other Ambulatory Visit: Payer: Self-pay

## 2020-07-11 VITALS — BP 134/84 | HR 77 | Temp 97.0°F | Resp 16 | Wt 140.6 lb

## 2020-07-11 DIAGNOSIS — J392 Other diseases of pharynx: Secondary | ICD-10-CM

## 2020-07-11 DIAGNOSIS — R058 Other specified cough: Secondary | ICD-10-CM | POA: Diagnosis not present

## 2020-07-11 DIAGNOSIS — R0982 Postnasal drip: Secondary | ICD-10-CM

## 2020-07-11 MED ORDER — AZITHROMYCIN 250 MG PO TABS: ORAL_TABLET | ORAL | 0 refills | Status: DC

## 2020-07-11 NOTE — Progress Notes (Signed)
Subjective:    Patient ID: Jerry Rivera, male    DOB: 20-Jul-1980, 40 y.o.   MRN: [id number]  HPI Chief Complaint  Patient presents with  . sinus issues    Sinus issues- chronic cough, and nasal drip. Went to ENT last year. Having issue exercise and wants to further treatment   Here with complaints of a 5 year history of URI symptoms that have been worsening over the past few months.   Reports having post nasal drainage (more than usual) and productive cough for the past month or so and coughing up "chunks" in the morning.  The color is green.  Denies fever, chills, headache, nasal congestion, sinus pressure, or rhinorrhea.   Denies chest pain, shortness of breath, wheezing, orthopnea, abdominal pain, N/V/D, LE edema.   Reports symptoms have been interfering with exercise. States he has to stop due to coughing.   Sleep issues now waking up in the mornings with mucus and has to cough it up.   States his throat and chest feel "raw from coughing so much".   States Mucinex helps him cough it up but he has been taking it every other day on average.    He has seen Dr. Ezzard Standing in the past. States the current symptoms feel different.  States he does not have issues breathing through his nose.   Reports history of allergies in the past (April-May)   History of trying PPI without any real improvement but none lately. Denies heartburn symptoms.   Reviewed allergies, medications, past medical, surgical, family, and social history.     Review of Systems Pertinent positives and negatives in the history of present illness.     Objective:   Physical Exam Constitutional:      General: He is not in acute distress.    Appearance: Normal appearance. He is not ill-appearing.  HENT:     Right Ear: Tympanic membrane and ear canal normal.     Left Ear: Tympanic membrane and ear canal normal.     Nose: Septal deviation and mucosal edema present.     Right Turbinates: Swollen.     Left  Turbinates: Swollen.     Right Sinus: No maxillary sinus tenderness or frontal sinus tenderness.     Left Sinus: No maxillary sinus tenderness or frontal sinus tenderness.  Eyes:     Conjunctiva/sclera: Conjunctivae normal.     Pupils: Pupils are equal, round, and reactive to light.  Cardiovascular:     Rate and Rhythm: Normal rate and regular rhythm.     Pulses: Normal pulses.     Heart sounds: Normal heart sounds.  Pulmonary:     Effort: Pulmonary effort is normal.     Breath sounds: Normal breath sounds.  Musculoskeletal:     Cervical back: Normal range of motion and neck supple.  Lymphadenopathy:     Cervical: No cervical adenopathy.  Skin:    General: Skin is warm and dry.  Neurological:     Mental Status: He is alert.    BP 134/84   Pulse 77   Temp (!) 97 F (36.1 C)   Wt 140 lb 9.6 oz (63.8 kg)   BMI 20.17 kg/m       Assessment & Plan:  Productive cough - Plan: DG Chest 2 View, azithromycin (ZITHROMAX) 250 MG tablet  Cough present for greater than 3 weeks - Plan: DG Chest 2 View  Post-nasal drainage  Throat irritation  Discussed multiple etiologies for his symptoms including allergies,  GERD, bacterial infection.  Azithromycin prescribed.  He will go to [facility] imaging for chest x-ray.  Recommend over-the-counter Xyzal, Flonase as well as a PPI over-the-counter.  Recommend maximizing treatment for allergies and GERD and following up in 4 weeks or sooner if needed.  Consider referral to pulmonology or back to ENT

## 2020-07-11 NOTE — Patient Instructions (Signed)
Go to [facility] imaging for your chest x-ray.  You do not need an appointment.  Try over-the-counter Xyzal once daily for the next 4 weeks. Also try Flonase or a nasal steroid spray daily.  I am prescribing an antibiotic for you to take for the next 5 days.  It continues working for an additional 5 days.  I also recommend continuing with Mucinex as needed.  I also recommend trying an over-the-counter PPI such as Dexilant, Nexium or Prilosec.  You can do salt water gargles for throat irritation.  Make sure you are staying well-hydrated.

## 2020-07-14 ENCOUNTER — Ambulatory Visit
Admission: RE | Admit: 2020-07-14 | Discharge: 2020-07-14 | Disposition: A | Discharge status: Home / Self Care | Payer: 59 | Source: Ambulatory Visit | Attending: Family Medicine | Admitting: Family Medicine

## 2020-07-14 DIAGNOSIS — R058 Other specified cough: Secondary | ICD-10-CM

## 2020-07-16 NOTE — Progress Notes (Signed)
Please let him know that his chest XR showed that he may have some hyperinflation of his lungs. I would like to have a pulmonologist evaluate him since this finding may or may not be significant. He will most likely need further testing. Please put in referral to pulmonology for abnormal chest XR, chronic and worsening cough.

## 2020-07-17 ENCOUNTER — Other Ambulatory Visit: Payer: Self-pay | Admitting: Internal Medicine

## 2020-07-17 DIAGNOSIS — R9389 Abnormal findings on diagnostic imaging of other specified body structures: Secondary | ICD-10-CM

## 2020-08-01 ENCOUNTER — Ambulatory Visit: Payer: 59 | Admitting: Family Medicine

## 2020-08-04 ENCOUNTER — Ambulatory Visit: Payer: 59 | Admitting: Family Medicine

## 2020-08-08 NOTE — Progress Notes (Deleted)
Jerry Rivera is a 40 y.o. male who presents for annual wellness visit and follow-up on chronic medical conditions.  He has the following concerns:    There is no immunization history on file for this patient. Last colonoscopy: Last PSA: Dentist: Ophtho: Exercise:  Other doctors caring for patient include:   Depression screen:  See questionnaire below.     Depression screen PHQ 2/9 07/29/2019  Decreased Interest 0  Down, Depressed, Hopeless 0  PHQ - 2 Score 0    Fall Screen: See Questionaire below.   Fall Risk  07/29/2019  Falls in the past year? 0  Number falls in past yr: 0  Injury with Fall? 0    ADL screen:  See questionnaire below.  Functional Status Survey:     End of Life Discussion:  Patient {ACTIONS; HAS/DOES NOT HAVE:19233} a living will and medical power of attorney   Review of Systems  Constitutional: -fever, -chills, -sweats, -unexpected weight change, -anorexia, -fatigue Allergy: -sneezing, -itching, -congestion Dermatology: denies changing moles, rash, lumps, new worrisome lesions ENT: -runny nose, -ear pain, -sore throat, -hoarseness, -sinus pain, -teeth pain, -tinnitus, -hearing loss, -epistaxis Cardiology:  -chest pain, -palpitations, -edema, -orthopnea, -paroxysmal nocturnal dyspnea Respiratory: -cough, -shortness of breath, -dyspnea on exertion, -wheezing, -hemoptysis Gastroenterology: -abdominal pain, -nausea, -vomiting, -diarrhea, -constipation, -blood in stool, -changes in bowel movement, -dysphagia Hematology: -bleeding or bruising problems Musculoskeletal: -arthralgias, -myalgias, -joint swelling, -back pain, -neck pain, -cramping, -gait changes Ophthalmology: -vision changes, -eye redness, -itching, -discharge Urology: -dysuria, -difficulty urinating, -hematuria, -urinary frequency, -urgency, incontinence Neurology: -headache, -weakness, -tingling, -numbness, -speech abnormality, -memory loss, -falls, -dizziness Psychology:  -depressed mood,  -agitation, -sleep problems   PHYSICAL EXAM:  There were no vitals taken for this visit.  General Appearance: Alert, cooperative, no distress, appears stated age Head: Normocephalic, without obvious abnormality, atraumatic Eyes: PERRL, conjunctiva/corneas clear, EOM's intact, fundi benign Ears: Normal TM's and external ear canals Nose: Nares normal, mucosa normal, no drainage or sinus   tenderness Throat: Lips, mucosa, and tongue normal; teeth and gums normal Neck: Supple, no lymphadenopathy, thyroid:no enlargement/tenderness/nodules; no carotid bruit or JVD Back: Spine nontender, no curvature, ROM normal, no CVA tenderness Lungs: Clear to auscultation bilaterally without wheezes, rales or ronchi; respirations unlabored Chest Wall: No tenderness or deformity Heart: Regular rate and rhythm, S1 and S2 normal, no murmur, rub or gallop Breast Exam: No chest wall tenderness, masses or gynecomastia Abdomen: Soft, non-tender, nondistended, normoactive bowel sounds, no masses, no hepatosplenomegaly Genitalia: Normal male external genitalia without lesions.  Testicles without masses.  No inguinal hernias. Rectal: Normal sphincter tone, no masses or tenderness; guaiac negative stool.  Prostate smooth, no nodules, not enlarged. Extremities: No clubbing, cyanosis or edema Pulses: 2+ and symmetric all extremities Skin: Skin color, texture, turgor normal, no rashes or lesions Lymph nodes: Cervical, supraclavicular, and axillary nodes normal Neurologic: CNII-XII intact, normal strength, sensation and gait; reflexes 2+ and symmetric throughout   Psych: Normal mood, affect, hygiene and grooming  ASSESSMENT/PLAN:    Discussed PSA screening (risks/benefits), recommended at least 30 minutes of aerobic activity at least 5 days/week; proper sunscreen use reviewed; healthy diet and alcohol recommendations (less than or equal to 2 drinks/day) reviewed; regular seatbelt use; changing batteries in smoke  detectors. Immunization recommendations discussed.  Colonoscopy recommendations reviewed.   Medicare Attestation I have personally reviewed: The patient's medical and social history Their use of alcohol, tobacco or illicit drugs Their current medications and supplements The patient's functional ability including ADLs,fall risks, home safety risks, cognitive, and  hearing and visual impairment Diet and physical activities Evidence for depression or mood disorders  The patient's weight, height, and BMI have been recorded in the chart.  I have made referrals, counseling, and provided education to the patient based on review of the above and I have provided the patient with a written personalized care plan for preventive services.     Hetty Blend, NP-C   08/08/2020

## 2020-08-09 ENCOUNTER — Ambulatory Visit (INDEPENDENT_AMBULATORY_CARE_PROVIDER_SITE_OTHER): Payer: 59 | Admitting: Family Medicine

## 2020-08-09 ENCOUNTER — Encounter: Payer: Self-pay | Admitting: Family Medicine

## 2020-08-09 ENCOUNTER — Ambulatory Visit: Payer: 59 | Admitting: Family Medicine

## 2020-08-09 ENCOUNTER — Other Ambulatory Visit: Payer: Self-pay

## 2020-08-09 VITALS — BP 120/80 | HR 79 | Ht 70.0 in | Wt 143.0 lb

## 2020-08-09 DIAGNOSIS — R053 Chronic cough: Secondary | ICD-10-CM | POA: Diagnosis not present

## 2020-08-09 DIAGNOSIS — R062 Wheezing: Secondary | ICD-10-CM

## 2020-08-09 DIAGNOSIS — E7800 Pure hypercholesterolemia, unspecified: Secondary | ICD-10-CM | POA: Insufficient documentation

## 2020-08-09 DIAGNOSIS — Z1159 Encounter for screening for other viral diseases: Secondary | ICD-10-CM

## 2020-08-09 DIAGNOSIS — Z0000 Encounter for general adult medical examination without abnormal findings: Secondary | ICD-10-CM

## 2020-08-09 DIAGNOSIS — Z1329 Encounter for screening for other suspected endocrine disorder: Secondary | ICD-10-CM

## 2020-08-09 MED ORDER — ALBUTEROL SULFATE HFA 108 (90 BASE) MCG/ACT IN AERS: 2.0000 | INHALATION_SPRAY | Freq: Four times a day (QID) | RESPIRATORY_TRACT | 0 refills | Status: DC | PRN

## 2020-08-09 NOTE — Progress Notes (Signed)
Subjective:    Patient ID: Jerry Rivera, male    DOB: [birth year], 40 y.o.   MRN: [id number]  HPI Chief Complaint  Patient presents with  . cpe    Fasting cpe , no concerns   He is here for a complete physical exam.  Other providers: Cardiologist- [facility]  GI- Dr. Russella Dar [place] - eyes   No improvement in cough since his last visit.  Continues to have a lot of mucus buildup. he has been trying a PPI as well as an antihistamine. Former occasional cigar smoker. Once monthly usually  Recent chest x-ray showed hyperinflated lungs. No history of asthma or COPD. Upcoming appointment with pulmonology scheduled.  LDL 158 last year.   Social history: Lives withwife and son who is 68 years old,degreeInternational Business.  Speaksa second language-Japanese  Diet: fairly healthy. Cut back on red meat. He likes butter a lot  Exercise: jogging   Immunizations: Tdap in the past 10 years  In [place] in 2012   Health maintenance:  Colonoscopy: N/A Last PSA: N/A Last Dental Exam: Up-to-date Last Eye Exam: Up-to-date  Wears seatbelt always, uses sunscreen, smoke detectors in home and functioning, does not text while driving, feels safe in home environment.  Reviewed allergies, medications, past medical, surgical, family, and social history.    Review of Systems Review of Systems Constitutional: -fever, -chills, -sweats, -unexpected weight change,-fatigue ENT: -runny nose, -ear pain, -sore throat Cardiology:  -chest pain, -palpitations, -edema Respiratory: +cough, -shortness of breath, -wheezing Gastroenterology: -abdominal pain, -nausea, -vomiting, -diarrhea, -constipation  Hematology: -bleeding or bruising problems Musculoskeletal: -arthralgias, -myalgias, -joint swelling, -back pain Ophthalmology: -vision changes Urology: -dysuria, -difficulty urinating, -hematuria, -urinary frequency, -urgency Neurology: -headache, -weakness, -tingling,  -numbness       Objective:   Physical Exam BP 120/80   Pulse 79   Ht 5\' 10"  (1.778 m)   Wt 143 lb (64.9 kg)   BMI 20.52 kg/m   General Appearance:    Alert, cooperative, no distress, appears stated age  Head:    Normocephalic, without obvious abnormality, atraumatic  Eyes:    PERRL, conjunctiva/corneas clear, EOM's intact  Ears:    Normal TM's and external ear canals  Nose:   Mask on   Throat:   Mask on   Neck:   Supple, no lymphadenopathy;  thyroid:  no   enlargement/tenderness/nodules; no JVD  Back:    Spine nontender, no curvature, ROM normal, no CVA     tenderness  Lungs:     Bilateral inspiratory wheezes. respirations unlabored  Chest Wall:    No tenderness or deformity   Heart:    Regular rate and rhythm, S1 and S2 normal, no murmur, rub   or gallop  Breast Exam:    No chest wall tenderness, masses or gynecomastia  Abdomen:     Soft, non-tender, nondistended, normoactive bowel sounds,    no masses, no hepatosplenomegaly  Genitalia:    Not done      Extremities:   No clubbing, cyanosis or edema  Pulses:   2+ and symmetric all extremities  Skin:   Skin color, texture, turgor normal, no rashes or lesions  Lymph nodes:   Cervical, supraclavicular, and axillary nodes normal  Neurologic:   CNII-XII intact, normal strength, sensation and gait          Psych:   Normal mood, affect, hygiene and grooming.        Assessment & Plan:  Routine general medical examination at a health  care facility - Plan: CBC with Differential/Platelet, Comprehensive metabolic panel, TSH, Lipid panel -Preventive health care reviewed.  Recommend self testicular exams.  I also recommend regular dental and eye exams.  Counseled on healthy lifestyle including diet and exercise.  Immunizations reviewed.  Discussed safety  Chronic cough - Plan: albuterol (VENTOLIN HFA) 108 (90 Base) MCG/ACT inhaler -Today he is wheezing for the first time.  Albuterol prescribed and discussed how to use this properly.   Upcoming appointment with pulmonology.  He may continue PPI and allergy medication for now however these do not seem to be helping with his productive cough.  Recently completed antibiotics.  Wheezing - Plan: albuterol (VENTOLIN HFA) 108 (90 Base) MCG/ACT inhaler -Use albuterol and follow-up with pulmonologist.  Elevated LDL cholesterol level - Plan: Lipid panel -Recommend low-fat diet and getting adequate exercise  Need for hepatitis C screening test - Plan: Hepatitis C antibody -Done per screening guidelines  Screening for thyroid disorder - Plan: TSH

## 2020-08-09 NOTE — Patient Instructions (Addendum)
Try the albuterol inhaler and let's see if that helps.  See the pulmonologist as scheduled.   I will be in touch with your lab results.    Preventive Care 66-41 Years Old, Male Preventive care refers to lifestyle choices and visits with your health care provider that can promote health and wellness. This includes:  A yearly physical exam. This is also called an annual wellness visit.  Regular dental and eye exams.  Immunizations.  Screening for certain conditions.  Healthy lifestyle choices, such as: ? Eating a healthy diet. ? Getting regular exercise. ? Not using drugs or products that contain nicotine and tobacco. ? Limiting alcohol use. What can I expect for my preventive care visit? Physical exam Your health care provider will check your:  Height and weight. These may be used to calculate your BMI (body mass index). BMI is a measurement that tells if you are at a healthy weight.  Heart rate and blood pressure.  Body temperature.  Skin for abnormal spots. Counseling Your health care provider may ask you questions about your:  Past medical problems.  Family's medical history.  Alcohol, tobacco, and drug use.  Emotional well-being.  Home life and relationship well-being.  Sexual activity.  Diet, exercise, and sleep habits.  Work and work Astronomer.  Access to firearms. What immunizations do I need? Vaccines are usually given at various ages, according to a schedule. Your health care provider will recommend vaccines for you based on your age, medical history, and lifestyle or other factors, such as travel or where you work.   What tests do I need? Blood tests  Lipid and cholesterol levels. These may be checked every 5 years, or more often if you are over 41 years old.  Hepatitis C test.  Hepatitis B test. Screening  Lung cancer screening. You may have this screening every year starting at age 41 if you have a 30-pack-year history of smoking and  currently smoke or have quit within the past 15 years.  Prostate cancer screening. Recommendations will vary depending on your family history and other risks.  Genital exam to check for testicular cancer or hernias.  Colorectal cancer screening. ? All adults should have this screening starting at age 41 and continuing until age 37. ? Your health care provider may recommend screening at age 41 if you are at increased risk. ? You will have tests every 1-10 years, depending on your results and the type of screening test.  Diabetes screening. ? This is done by checking your blood sugar (glucose) after you have not eaten for a while (fasting). ? You may have this done every 1-3 years.  STD (sexually transmitted disease) testing, if you are at risk. Follow these instructions at home: Eating and drinking  Eat a diet that includes fresh fruits and vegetables, whole grains, lean protein, and low-fat dairy products.  Take vitamin and mineral supplements as recommended by your health care provider.  Do not drink alcohol if your health care provider tells you not to drink.  If you drink alcohol: ? Limit how much you have to 0-2 drinks a day. ? Be aware of how much alcohol is in your drink. In the [place] one drink equals one 12 oz bottle of beer (355 mL), one 5 oz glass of wine (148 mL), or one 1 oz glass of hard liquor (44 mL).   Lifestyle  Take daily care of your teeth and gums. Brush your teeth every morning and night with fluoride toothpaste.  Floss one time each day.  Stay active. Exercise for at least 30 minutes 5 or more days each week.  Do not use any products that contain nicotine or tobacco, such as cigarettes, e-cigarettes, and chewing tobacco. If you need help quitting, ask your health care provider.  Do not use drugs.  If you are sexually active, practice safe sex. Use a condom or other form of protection to prevent STIs (sexually transmitted infections).  If told by your  health care provider, take low-dose aspirin daily starting at age 41.  Find healthy ways to cope with stress, such as: ? Meditation, yoga, or listening to music. ? Journaling. ? Talking to a trusted person. ? Spending time with friends and family. Safety  Always wear your seat belt while driving or riding in a vehicle.  Do not drive: ? If you have been drinking alcohol. Do not ride with someone who has been drinking. ? When you are tired or distracted. ? While texting.  Wear a helmet and other protective equipment during sports activities.  If you have firearms in your house, make sure you follow all gun safety procedures. What's next?  Go to your health care provider once a year for an annual wellness visit.  Ask your health care provider how often you should have your eyes and teeth checked.  Stay up to date on all vaccines. This information is not intended to replace advice given to you by your health care provider. Make sure you discuss any questions you have with your health care provider. Document Revised: 01/05/2019 Document Reviewed: 04/02/2018 Elsevier Patient Education  2021 [employer].

## 2020-08-10 LAB — COMPREHENSIVE METABOLIC PANEL
ALT: 22 IU/L (ref 0–44)
AST: 23 IU/L (ref 0–40)
Albumin/Globulin Ratio: 1.9 (ref 1.2–2.2)
Albumin: 5.1 g/dL — ABNORMAL HIGH (ref 4.0–5.0)
Alkaline Phosphatase: 78 IU/L (ref 44–121)
BUN/Creatinine Ratio: 14 (ref 9–20)
BUN: 13 mg/dL (ref 6–24)
Bilirubin Total: 0.6 mg/dL (ref 0.0–1.2)
CO2: 23 mmol/L (ref 20–29)
Calcium: 9.6 mg/dL (ref 8.7–10.2)
Chloride: 99 mmol/L (ref 96–106)
Creatinine, Ser: 0.96 mg/dL (ref 0.76–1.27)
Globulin, Total: 2.7 g/dL (ref 1.5–4.5)
Glucose: 99 mg/dL (ref 65–99)
Potassium: 5 mmol/L (ref 3.5–5.2)
Sodium: 140 mmol/L (ref 134–144)
Total Protein: 7.8 g/dL (ref 6.0–8.5)
eGFR: 102 mL/min/{1.73_m2} (ref >59)

## 2020-08-10 LAB — CBC WITH DIFFERENTIAL/PLATELET
Basophils Absolute: 0 10*3/uL (ref 0.0–0.2)
Basos: 1 %
EOS (ABSOLUTE): 0.1 10*3/uL (ref 0.0–0.4)
Eos: 1 %
Hematocrit: 48 % (ref 37.5–51.0)
Hemoglobin: 16.3 g/dL (ref 13.0–17.7)
Immature Grans (Abs): 0 10*3/uL (ref 0.0–0.1)
Immature Granulocytes: 0 %
Lymphocytes Absolute: 1.4 10*3/uL (ref 0.7–3.1)
Lymphs: 26 %
MCH: 30.5 pg (ref 26.6–33.0)
MCHC: 34 g/dL (ref 31.5–35.7)
MCV: 90 fL (ref 79–97)
Monocytes Absolute: 0.3 10*3/uL (ref 0.1–0.9)
Monocytes: 7 %
Neutrophils Absolute: 3.4 10*3/uL (ref 1.4–7.0)
Neutrophils: 65 %
Platelets: 223 10*3/uL (ref 150–450)
RBC: 5.35 x10E6/uL (ref 4.14–5.80)
RDW: 12.1 % (ref 11.6–15.4)
WBC: 5.3 10*3/uL (ref 3.4–10.8)

## 2020-08-10 LAB — LIPID PANEL
Chol/HDL Ratio: 3.5 ratio (ref 0.0–5.0)
Cholesterol, Total: 267 mg/dL — ABNORMAL HIGH (ref 100–199)
HDL: 76 mg/dL (ref >39)
LDL Chol Calc (NIH): 181 mg/dL — ABNORMAL HIGH (ref 0–99)
Triglycerides: 62 mg/dL (ref 0–149)
VLDL Cholesterol Cal: 10 mg/dL (ref 5–40)

## 2020-08-10 LAB — TSH: TSH: 1.67 u[IU]/mL (ref 0.450–4.500)

## 2020-08-10 LAB — HEPATITIS C ANTIBODY: Hep C Virus Ab: <0.1 s/co ratio (ref 0.0–0.9)

## 2020-08-10 NOTE — Progress Notes (Signed)
Please let him know that his LDL or bad cholesterol is higher this year at 181.  As discussed, this may be familial and nothing to do with his diet or exercise routine.  When I calculate his overall risk factors for heart disease over the next 10 years, he does NOT need to be on a statin currently.  Continue taking good care of himself but if his LDL is higher than 190 then it is recommended that we start him on a cholesterol medication called a statin for prevention to help lower his cholesterol.  His HDL or good cholesterol is still high which is protective.  Otherwise, his labs are normal.

## 2020-08-11 ENCOUNTER — Ambulatory Visit: Payer: 59 | Admitting: Family Medicine

## 2020-08-24 ENCOUNTER — Institutional Professional Consult (permissible substitution): Payer: 59 | Admitting: Internal Medicine

## 2020-08-31 ENCOUNTER — Other Ambulatory Visit: Payer: Self-pay | Admitting: Family Medicine

## 2020-08-31 DIAGNOSIS — R053 Chronic cough: Secondary | ICD-10-CM

## 2020-08-31 DIAGNOSIS — R062 Wheezing: Secondary | ICD-10-CM

## 2020-08-31 NOTE — Telephone Encounter (Signed)
Left message for pt to call me back 

## 2020-08-31 NOTE — Telephone Encounter (Signed)
Pt was notified and does not need a refill?

## 2020-09-12 ENCOUNTER — Other Ambulatory Visit: Payer: Self-pay

## 2020-09-12 ENCOUNTER — Ambulatory Visit (INDEPENDENT_AMBULATORY_CARE_PROVIDER_SITE_OTHER): Payer: 59 | Admitting: Pulmonary Disease

## 2020-09-12 ENCOUNTER — Encounter: Payer: Self-pay | Admitting: Pulmonary Disease

## 2020-09-12 VITALS — BP 118/70 | HR 77 | Temp 98.0°F | Ht 69.0 in | Wt 142.6 lb

## 2020-09-12 DIAGNOSIS — R9389 Abnormal findings on diagnostic imaging of other specified body structures: Secondary | ICD-10-CM | POA: Diagnosis not present

## 2020-09-12 DIAGNOSIS — R053 Chronic cough: Secondary | ICD-10-CM

## 2020-09-12 MED ORDER — BREO ELLIPTA 200-25 MCG/INH IN AEPB: 1.0000 | INHALATION_SPRAY | Freq: Every day | RESPIRATORY_TRACT | 3 refills | Status: DC

## 2020-09-12 NOTE — Progress Notes (Signed)
Subjective:   PATIENT ID: Jerry Rivera GENDER: male DOB: October 13, 1980, MRN: [id number]   HPI  Chief Complaint  Patient presents with  . Consult    Coughing and wheezing worse in past few months, reports cough for a few years. Had xray that showed hyperinflated lungs and was started on albuterol.     Reason for Visit: New consult for cough  [name] is a 40 year old male with HTN who presents for chronic cough.  Five year history of cough that has worsened in the last year. Has been treated with antibiotics, reflux regimen (2 weeks) and allergy regimen (xyzol x 1 month). Denies sinus pressure, congestion or allergy symptoms. Denies frequent heartburn symptoms.   He has a productive cough that is worse in the morning and continues during the day. Associated with wheezing. Also seemed to be associated with eating and drinking at times but now feels like he is constantly clearing his throat which has progressed to coughing fits. Has been evaluated by ENT and found with a deviated septum and holding on surgery until his medical evaluation has been completed. He has not noticed any change in symptoms when going out of state or when leaving his 100 year home. He has been using albuterol two puffs in the morning in the last month which helps his symptom by 15%. Denies wheezing but has prolonged expiration. He has light green sputum production with significant production.  He is not a regular smoker. Smokes one cigar a year. Denies vaping. Both parents were smokers in the home and car, using at least a carton a week. No formal diagnosis of asthma in the family.   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  Past Medical History:  Diagnosis  Date  . Allergies   . Hypertension   . Palpitations      Family History  Problem Relation Age of Onset  . Colon cancer Mother   . Stomach cancer Mother   . Hypertension Father   . Cirrhosis Paternal Uncle      Social History   Occupational History  . Not on file  Tobacco Use  . Smoking status: Current Some Day Smoker    Packs/day: 0.00    Years: 10.00    Pack years: 0.00    Types: Cigars    Start date: 2010  . Smokeless tobacco: Never Used  . Tobacco comment: max 4 cigars a year, now 1 a year 09/12/2020  Vaping Use  . Vaping Use: Never used  Substance and Sexual Activity  . Alcohol use: Yes    Comment: 10-14 beers weekly  . Drug use: Never  . Sexual activity: Yes    No Known Allergies   Outpatient Medications Prior to Visit  Medication Sig Dispense Refill  . albuterol (VENTOLIN HFA) 108 (90 Base) MCG/ACT inhaler Inhale 2 puffs into the lungs every 6 (six) hours as needed for wheezing or shortness of breath. 8 g 0  . metoprolol succinate (TOPROL-XL) 25 MG 24 hr tablet Take 1 tablet (25 mg total) by mouth daily. Take with or immediately following a meal. 90 tablet 3  . Multiple Vitamin (MULTIVITAMIN) tablet Take 1 tablet by mouth daily.    . fluticasone (FLONASE) 50 MCG/ACT nasal spray Place into both nostrils daily as  needed. (Patient not taking: Reported on 09/12/2020)    . ibuprofen (ADVIL) 200 MG tablet Take 200 mg by mouth as needed (pain). (Patient not taking: Reported on 09/12/2020)     No facility-administered medications prior to visit.    Review of Systems  Constitutional: Negative for chills, diaphoresis, fever, malaise/fatigue and weight loss.  HENT: Negative for congestion, ear pain and sore throat.   Respiratory: Positive for cough, sputum production and wheezing. Negative for hemoptysis and shortness of breath.   Cardiovascular: Negative for chest pain, palpitations and leg swelling.  Gastrointestinal: Negative for abdominal pain, heartburn and nausea.   Genitourinary: Negative for frequency.  Musculoskeletal: Negative for joint pain and myalgias.  Skin: Negative for itching and rash.  Neurological: Negative for dizziness, weakness and headaches.  Endo/Heme/Allergies: Does not bruise/bleed easily.  Psychiatric/Behavioral: Negative for depression. The patient is not nervous/anxious.      Objective:   Vitals:   09/12/20 0940  BP: 118/70  Pulse: 77  Temp: 98 F (36.7 C)  TempSrc: Temporal  SpO2: 98%  Weight: 142 lb 9.6 oz (64.7 kg)  Height: 5\' 9"  (1.753 m)   SpO2: 98 % (RA) O2 Device: None (Room air)  Physical Exam: General: Well-appearing, no acute distress HENT: [place], AT Eyes: EOMI, no scleral icterus Respiratory: LLL focal rhonchi.  No wheezing or rales Cardiovascular: RRR, -M/R/G, no JVD Extremities:-Edema,-tenderness Neuro: AAO x4, CNII-XII grossly intact Skin: Intact, no rashes or bruising Psych: Normal mood, normal affect  Data Reviewed:  Imaging: CXR 07/14/20 - Hyperinflation   PFT: None on file  Labs: CBC    Component Value Date/Time   WBC 5.3 08/09/2020 1105   RBC 5.35 08/09/2020 1105   HGB 16.3 08/09/2020 1105   HCT 48.0 08/09/2020 1105   PLT 223 08/09/2020 1105   MCV 90 08/09/2020 1105   MCH 30.5 08/09/2020 1105   MCHC 34.0 08/09/2020 1105   RDW 12.1 08/09/2020 1105   LYMPHSABS 1.4 08/09/2020 1105   EOSABS 0.1 08/09/2020 1105   BASOSABS 0.0 08/09/2020 1105   Absolute eos 08/09/20 100   Imaging, labs and test noted above have been reviewed independently by me.    Assessment & Plan:   Discussion: 40 year old male with HTN who presents with longstanding history of chronic productive cough associated with wheezing that has worsened in the last year. We discussed common causes of cough including UACS, reflux and asthma. Given his significant sputum production and focal rhonchi noted on exam, will obtain CT chest for further evaluation including possible bronchiectasis.  Chronic Cough --START  Breo 200-25 mcg ONE puff ONCE a day --CONTINUE Albuterol as needed for wheezing --ARRANGE for pulmonary function tests --ORDER CT Chest without contrast to rule out bronchiectasis --Mucinex as needed    Health Maintenance Immunization History  Administered Date(s) Administered  . PFIZER(Purple Top)SARS-COV-2 Vaccination 07/16/2019, 08/06/2019, 03/23/2020   CT Lung Screen - not indicated  Orders Placed This Encounter  Procedures  . CT Chest Wo Contrast    CT chest without contrast next available. Thanks! SW 14/05/2019 [place] [facility] Not diabetic/ no HTN U[employer] ins    Standing Status:   Future    Number of Occurrences:   1    Standing Expiration Date:   09/12/2021    Order Specific Question:   Preferred imaging location?    Answer:   [place] [facility]  . Pulmonary function test    Standing Status:   Future    Standing Expiration Date:   09/12/2021  Order Specific Question:   Where should this test be performed?    Answer:   [facility]    Order Specific Question:   Full PFT: includes the following: basic spirometry, spirometry pre & post bronchodilator, diffusion capacity (DLCO), lung volumes    Answer:   Full PFT    Order Specific Question:   MIP/MEP    Answer:   No    Order Specific Question:   6 minute walk    Answer:   No    Order Specific Question:   ABG    Answer:   No    Order Specific Question:   Diffusion capacity (DLCO)    Answer:   Yes    Order Specific Question:   Lung volumes    Answer:   Yes    Order Specific Question:   Methacholine challenge    Answer:   No   Meds ordered this encounter  Medications  . fluticasone furoate-vilanterol (BREO ELLIPTA) 200-25 MCG/INH AEPB    Sig: Inhale 1 puff into the lungs daily.    Dispense:  60 each    Refill:  3    Return in about 1 month (around 10/13/2020).  I have spent a total time of 45-minutes on the day of the appointment reviewing prior documentation, coordinating care and discussing medical diagnosis and  plan with the patient/family. Imaging, labs and tests included in this note have been reviewed and interpreted independently by me.  Meta Kroenke Mechele Collin, MD [facility] 09/12/2020 9:58 AM  Office Number [phone]

## 2020-09-12 NOTE — Patient Instructions (Signed)
Chronic Cough --START Breo 200-25 mcg ONE puff ONCE a day --CONTINUE Albuterol as needed for wheezing --ARRANGE for pulmonary function tests --ORDER CT Chest without contrast to rule out bronchiectasis --Mucinex as needed  Follow-up with me in one month

## 2020-09-20 ENCOUNTER — Other Ambulatory Visit: Payer: Self-pay

## 2020-09-20 ENCOUNTER — Ambulatory Visit (INDEPENDENT_AMBULATORY_CARE_PROVIDER_SITE_OTHER)
Admission: RE | Admit: 2020-09-20 | Discharge: 2020-09-20 | Disposition: A | Discharge status: Home / Self Care | Payer: 59 | Source: Ambulatory Visit | Attending: Pulmonary Disease | Admitting: Pulmonary Disease

## 2020-09-20 DIAGNOSIS — R9389 Abnormal findings on diagnostic imaging of other specified body structures: Secondary | ICD-10-CM | POA: Diagnosis not present

## 2020-09-22 DIAGNOSIS — R9389 Abnormal findings on diagnostic imaging of other specified body structures: Secondary | ICD-10-CM | POA: Insufficient documentation

## 2020-09-22 NOTE — Progress Notes (Signed)
I called patient regarding mild bronchiectasis with mucous plugging. Encouraged to continue using Breo daily. Will discuss symptoms at next visit. If pulmonary toilet remains inadequate, can consider nebulizer and flutter valve.

## 2020-10-09 ENCOUNTER — Telehealth: Payer: Self-pay | Admitting: *Deleted

## 2020-10-09 ENCOUNTER — Other Ambulatory Visit: Payer: Self-pay | Admitting: Pulmonary Disease

## 2020-10-09 NOTE — Telephone Encounter (Signed)
Called and spoke with patient, advised that we received a fax from pharmacy that the Virgel Bouquet was not covered by his insurance.  He stated that his inhaler was $70, but it is working.  For now he will get it refilled, he has f/u in July and will discuss if this is something he will need in the future.  He will also check with his insurance.  Refills sent.  Nothing further needed.

## 2020-10-27 ENCOUNTER — Other Ambulatory Visit (HOSPITAL_COMMUNITY)
Admission: RE | Admit: 2020-10-27 | Discharge: 2020-10-27 | Disposition: A | Discharge status: Home / Self Care | Payer: 59 | Source: Ambulatory Visit | Attending: Pulmonary Disease | Admitting: Pulmonary Disease

## 2020-10-27 DIAGNOSIS — Z01812 Encounter for preprocedural laboratory examination: Secondary | ICD-10-CM | POA: Diagnosis present

## 2020-10-27 DIAGNOSIS — Z20822 Contact with and (suspected) exposure to covid-19: Secondary | ICD-10-CM | POA: Insufficient documentation

## 2020-10-27 LAB — SARS CORONAVIRUS 2 (TAT 6-24 HRS): SARS Coronavirus 2: NEGATIVE

## 2020-10-30 ENCOUNTER — Ambulatory Visit (INDEPENDENT_AMBULATORY_CARE_PROVIDER_SITE_OTHER): Payer: 59 | Admitting: Pulmonary Disease

## 2020-10-30 ENCOUNTER — Other Ambulatory Visit: Payer: Self-pay

## 2020-10-30 ENCOUNTER — Encounter: Payer: Self-pay | Admitting: Pulmonary Disease

## 2020-10-30 VITALS — BP 120/80 | HR 68 | Temp 98.1°F | Ht 69.0 in | Wt 141.8 lb

## 2020-10-30 DIAGNOSIS — R053 Chronic cough: Secondary | ICD-10-CM

## 2020-10-30 DIAGNOSIS — J479 Bronchiectasis, uncomplicated: Secondary | ICD-10-CM | POA: Diagnosis not present

## 2020-10-30 LAB — PULMONARY FUNCTION TEST
DL/VA % pred: 101 %
DL/VA: 4.73 ml/min/mmHg/L
DLCO cor % pred: 98 %
DLCO cor: 29.75 ml/min/mmHg
DLCO unc % pred: 98 %
DLCO unc: 29.75 ml/min/mmHg
FEF 25-75 Post: 4.38 L/sec
FEF 25-75 Pre: 3.74 L/sec
FEF2575-%Change-Post: 17 %
FEF2575-%Pred-Post: 112 %
FEF2575-%Pred-Pre: 96 %
FEV1-%Change-Post: 3 %
FEV1-%Pred-Post: 99 %
FEV1-%Pred-Pre: 96 %
FEV1-Post: 4.09 L
FEV1-Pre: 3.94 L
FEV1FVC-%Change-Post: 3 %
FEV1FVC-%Pred-Pre: 103 %
FEV6-%Change-Post: 0 %
FEV6-%Pred-Post: 95 %
FEV6-%Pred-Pre: 94 %
FEV6-Post: 4.78 L
FEV6-Pre: 4.74 L
FEV6FVC-%Change-Post: 0 %
FEV6FVC-%Pred-Post: 102 %
FEV6FVC-%Pred-Pre: 102 %
FVC-%Change-Post: 0 %
FVC-%Pred-Post: 93 %
FVC-%Pred-Pre: 92 %
FVC-Post: 4.78 L
FVC-Pre: 4.74 L
Post FEV1/FVC ratio: 86 %
Post FEV6/FVC ratio: 100 %
Pre FEV1/FVC ratio: 83 %
Pre FEV6/FVC Ratio: 100 %
RV % pred: 139 %
RV: 2.48 L
TLC % pred: 106 %
TLC: 7.18 L

## 2020-10-30 MED ORDER — FLUTICASONE FUROATE-VILANTEROL 200-25 MCG/INH IN AEPB: 1.0000 | INHALATION_SPRAY | Freq: Every day | RESPIRATORY_TRACT | 0 refills | Status: DC

## 2020-10-30 NOTE — Progress Notes (Signed)
Subjective:   PATIENT ID: Jerry Rivera GENDER: male DOB: Jul 02, 1980, MRN: [id number]   HPI  Chief Complaint  Patient presents with   Follow-up    PFT performed today.  Pt states that his cough is better compared to last visit.    Reason for Visit: Follow-up   Jerry Rivera is a 40 year old male with HTN who presents for chronic cough.  Synopsis: Five year history of cough that has worsened in the last year. Has been treated with antibiotics, reflux regimen (2 weeks) and allergy regimen (xyzol x 1 month).  2022- Started on Breo with improvement. Switched to Advair due to insurance.  He reports that since starting Breo he noticed that his cough has improved and is less productive. Able to clear his throat without significant sputum production. He rarely uses his albuterol since the maintenance inhaler is more effective. He is able to start jogging. Had to switch to fluticasone-salmeterol which seems to be effective however has only been on it for a week. He did have a period of missing inhaler for a day and has noticed return of sputum production. On his bad days he will need to be more forceful with his coughing. No wheezing.  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 Smokes one cigar a year. Denies vaping. Both parents were smokers in the home and car, using at least a carton a week. No formal diagnosis of asthma in the family.   I have personally reviewed patient's past medical/family/social history/allergies/current medications.  Past Medical History:  Diagnosis Date   Allergies    Hypertension    Palpitations     No Known Allergies   Outpatient Medications Prior to Visit  Medication Sig Dispense Refill   albuterol (VENTOLIN HFA) 108 (90 Base) MCG/ACT inhaler Inhale 2 puffs into the lungs every 6  (six) hours as needed for wheezing or shortness of breath. 8 g 0   Fluticasone-Salmeterol 232-14 MCG/ACT AEPB Inhale 1 puff into the lungs daily. 30 each 3   ibuprofen (ADVIL) 200 MG tablet Take 200 mg by mouth as needed (pain).     metoprolol succinate (TOPROL-XL) 25 MG 24 hr tablet Take 1 tablet (25 mg total) by mouth daily. Take with or immediately following a meal. 90 tablet 3   Multiple Vitamin (MULTIVITAMIN) tablet Take 1 tablet by mouth daily.     fluticasone (FLONASE) 50 MCG/ACT nasal spray Place into both nostrils daily as needed. (Patient not taking: Reported on 09/12/2020)     No facility-administered medications prior to visit.    Review of Systems  Constitutional:  Negative for chills, diaphoresis, fever, malaise/fatigue and weight loss.  HENT:  Negative for congestion.   Respiratory:  Positive for cough and sputum production. Negative for hemoptysis, shortness of breath and wheezing.   Cardiovascular:  Negative for chest pain, palpitations and leg swelling.    Objective:   Vitals:   10/30/20 1058  BP: 120/80  Pulse: 68  Temp: 98.1 F (36.7 C)  TempSrc: Oral  SpO2: 98%  Weight: 141 lb 12.8 oz (64.3 kg)  Height: 5\' 9"  (1.753 m)   SpO2: 98 % (RA) O2 Device: None (Room air)  Physical Exam: General: Well-appearing, no acute distress HENT: [place], AT Eyes: EOMI, no scleral icterus Respiratory: Clear to auscultation bilaterally.  No crackles, wheezing or  rales. LLL rhonchi has resolved Cardiovascular: RRR, -M/R/G, no JVD Extremities:-Edema,-tenderness Neuro: AAO x4, CNII-XII grossly intact Skin: Intact, no rashes or bruising Psych: Normal mood, normal affect  Data Reviewed:  Imaging: CXR 07/14/20 - Hyperinflation  CT Chest 09/20/20 - Mild bronchiectasis with mucous plugging  PFT: 10/30/20 FVC 4.78 (93%) FEV1 4.09 (99%) Ratio 83  TLC 106% DLCO 98% Interpretation: Normal spirometry. Normal lung volumes. Normal diffusion capacity. No significant bronchodilator  response.  Labs: CBC    Component Value Date/Time   WBC 5.3 08/09/2020 1105   RBC 5.35 08/09/2020 1105   HGB 16.3 08/09/2020 1105   HCT 48.0 08/09/2020 1105   PLT 223 08/09/2020 1105   MCV 90 08/09/2020 1105   MCH 30.5 08/09/2020 1105   MCHC 34.0 08/09/2020 1105   RDW 12.1 08/09/2020 1105   LYMPHSABS 1.4 08/09/2020 1105   EOSABS 0.1 08/09/2020 1105   BASOSABS 0.0 08/09/2020 1105   Absolute eos 08/09/20 100   Imaging, labs and test noted above have been reviewed independently by me.    Assessment & Plan:   Discussion: 40 year old male with bronchiectasis who presents for follow-up of chronic cough. Improved on bronchodilators on Breo and more active. Recently transitioned to fluticasone-salmeterol this week due to insurance. We reviewed PFTs - normal and reviewed CT chest - mild bronchiectasis with mucous plugging. Discussed the clinical course of bronchiectasis and focus on airway clearance and minimizing respiratory infections. Advised to call office for prolonged infections to evaluate for need for antibiotics or steroids if indicated.  Mild Bronchiectasis without complication Chronic cough - resolving  --CONTINUE fluticasone-salmeterol ONE puff ONCE a day. This is your EVERYDAY inhaler --For worsening symptoms, ok to increase to ONE puff TWICE a day for 3 days and use flutter valve 5-10x a day for mucous clearance  --CONTINUE Albuterol as needed for shortness of breath or wheezing. This is your RESCUE inhaler  --ORDER labs: Immunoglobulin panel --Mucinex as needed  Health Maintenance Immunization History  Administered Date(s) Administered   PFIZER(Purple Top)SARS-COV-2 Vaccination 07/16/2019, 08/06/2019, 03/23/2020   CT Lung Screen - not indicated  Orders Placed This Encounter  Procedures   IgG, IgA, IgM    Standing Status:   Future    Standing Expiration Date:   10/30/2021   Flutter valve    Standing Status:   Future    Standing Expiration Date:   10/30/2021    Meds ordered this encounter  Medications   fluticasone furoate-vilanterol (BREO ELLIPTA) 200-25 MCG/INH AEPB    Sig: Inhale 1 puff into the lungs daily.    Dispense:  14 each    Refill:  0    Order Specific Question:   Lot Number?    Answer:   [id number]    Order Specific Question:   Expiration Date?    Answer:   02/20/2021    Order Specific Question:   Manufacturer?    Answer:   [employer] [[id number]]    Order Specific Question:   Quantity    Answer:   1    Return in about 6 months (around 05/02/2021).  I have spent a total time of 35-minutes on the day of the appointment reviewing prior documentation, coordinating care and discussing medical diagnosis and plan with the patient/family. Imaging, labs and tests included in this note have been reviewed and interpreted independently by me.  Ahsley Attwood Mechele Collin, MD [facility] 10/30/2020 11:04 AM  Office Number [phone]

## 2020-10-30 NOTE — Progress Notes (Signed)
PFT done today. 

## 2020-10-30 NOTE — Patient Instructions (Addendum)
Mild Bronchiectasis without complication --CONTINUE fluticasone-salmeterol ONE puff ONCE a day. This is your EVERYDAY inhaler --For worsening symptoms, ok to increase to ONE puff TWICE a day for 3 days and use flutter valve 5-10x a day for mucous clearance  --CONTINUE Albuterol as needed for shortness of breath or wheezing. This is your RESCUE inhaler  --ORDER labs: Immunoglobulin panel  Follow-up with me in 6 months

## 2020-10-31 LAB — IGG, IGA, IGM
IgG (Immunoglobin G), Serum: 1131 mg/dL (ref 600–1640)
IgM, Serum: 57 mg/dL (ref 50–300)
Immunoglobulin A: 367 mg/dL — ABNORMAL HIGH (ref 47–310)

## 2021-03-09 ENCOUNTER — Other Ambulatory Visit: Payer: Self-pay | Admitting: Pulmonary Disease

## 2021-03-09 NOTE — Telephone Encounter (Signed)
Pharmacy comment: Alternative Requested:AIRDUO IS NOT COVERED UNDER THE INSURANCE. SOME OTHER ALTERNATIVES THAT WERE CHEAPER AND RECOMMENDED BY THE INSURANCE FLUTICASONE/SALMETEROL, ADVAIR DISKUS/HFA. BREA ELLIPTA, AND SYMBICORT.   Dr. Everardo All, please advise

## 2021-03-12 NOTE — Telephone Encounter (Signed)
  Pharmacy informing clinic that AirDuo is not covered by insurance.  Patient previously on Breo 200-25 mcg ONE puff ONCE a day with improvement in symptoms. Pharmacy recommends this one option that is now covered by insurance.  Staff - please order 6 month supply for patient in the morning. (Unable to send due to error)  Also please schedule patient for follow-up with me in January.

## 2021-03-13 ENCOUNTER — Other Ambulatory Visit: Payer: Self-pay | Admitting: Pulmonary Disease

## 2021-03-13 MED ORDER — FLUTICASONE FUROATE-VILANTEROL 200-25 MCG/ACT IN AEPB: 1.0000 | INHALATION_SPRAY | Freq: Every day | RESPIRATORY_TRACT | 2 refills | Status: DC

## 2021-03-14 NOTE — Telephone Encounter (Signed)
Ordered Advair Diskus 250-50 mcg ONE puff TWICE a day.  Please contact patient regarding prescription change and inhaler dosing. Also have patient or pharmacy contact us if this inhaler is not approved by his insurance. We may need patient to obtain formulary list at this point.

## 2021-03-14 NOTE — Telephone Encounter (Signed)
JE please advise. Virgel Bouquet is not covered by the pts insurance.   thanks

## 2021-03-16 MED ORDER — FLUTICASONE FUROATE-VILANTEROL 200-25 MCG/ACT IN AEPB: 1.0000 | INHALATION_SPRAY | Freq: Every day | RESPIRATORY_TRACT | 6 refills | Status: DC

## 2021-03-16 NOTE — Telephone Encounter (Signed)
Called and spoke with pharmacy to ask them about the Advair and to make sure that its covered and was told that it is covered but that it was still $95. Asked about the [facility] and told them that we got a message saying it was covered and then when we sent it in got message saying it wasn't. Was asked to send [facility] again and then to call them back. RX has been sent.

## 2021-03-16 NOTE — Telephone Encounter (Signed)
Attempted to call pt but unable to reach. Left message for him to return call. °

## 2021-03-16 NOTE — Telephone Encounter (Signed)
Called and spoke with pharmacy again and they stated that the [facility] was more expensive then the Advair. Advised patient that he may have a deductible that needs to be met first and that why they are covered but he still owes money. He said he was fine with the $95. Nothing further needed at this time.

## 2021-08-13 ENCOUNTER — Encounter: Payer: 59 | Admitting: Family Medicine

## 2021-09-03 ENCOUNTER — Encounter: Payer: Self-pay | Admitting: Physician Assistant

## 2021-09-03 ENCOUNTER — Other Ambulatory Visit: Payer: Self-pay | Admitting: Pulmonary Disease

## 2021-09-03 ENCOUNTER — Ambulatory Visit: Payer: 59 | Admitting: Physician Assistant

## 2021-09-03 VITALS — BP 120/84 | HR 75 | Temp 98.5°F | Ht 70.0 in | Wt 141.8 lb

## 2021-09-03 DIAGNOSIS — Z6820 Body mass index (BMI) 20.0-20.9, adult: Secondary | ICD-10-CM | POA: Diagnosis not present

## 2021-09-03 DIAGNOSIS — Z0000 Encounter for general adult medical examination without abnormal findings: Secondary | ICD-10-CM | POA: Diagnosis not present

## 2021-09-03 DIAGNOSIS — E785 Hyperlipidemia, unspecified: Secondary | ICD-10-CM

## 2021-09-03 DIAGNOSIS — Z800 Family history of malignant neoplasm of digestive organs: Secondary | ICD-10-CM | POA: Diagnosis not present

## 2021-09-03 DIAGNOSIS — I10 Essential (primary) hypertension: Secondary | ICD-10-CM | POA: Insufficient documentation

## 2021-09-03 NOTE — Patient Instructions (Addendum)
Preventative Care for Adults, Male ?   ?   REGULAR HEALTH EXAMS: ?A routine yearly physical is a good way to check in with your primary care provider about your health and preventive screening. It is also an opportunity to share updates about your health and any concerns you have, and receive a thorough all-over exam.  ?Most health insurance companies pay for at least some preventative services.  Check with your health plan for specific coverages. ? ?WHAT PREVENTATIVE SERVICES DO MEN NEED? ?Adult men should have their weight and blood pressure checked regularly.  ?Men age 35 and older should have their cholesterol levels checked regularly. ?Beginning at age 42 and continuing to age 75, men should be screened for colorectal cancer.  Certain people should may need continued testing until age 85. ?Other cancer screening may include exams for testicular and prostate cancer. ?Updating vaccinations is part of preventative care.  Vaccinations help protect against diseases such as the flu. ?Lab tests are generally done as part of preventative care to screen for anemia and blood disorders, to screen for problems with the kidneys and liver, to screen for bladder problems, to check blood sugar, and to check your cholesterol level. ?Preventative services generally include counseling about diet, exercise, avoiding tobacco, drugs, excessive alcohol consumption, and sexually transmitted infections.   ? ?GENERAL RECOMMENDATIONS FOR GOOD HEALTH: ? ?Healthy diet: ?Eat a variety of foods, including fruit, vegetables, animal or vegetable protein, such as meat, fish, chicken, and eggs, or beans, lentils, tofu, and grains, such as rice. ?Drink plenty of water daily (60 - 80 ounces or 8 - 10 glasses of water a day) ?Decrease saturated fat in the diet, avoid lots of red meat, processed foods, sweets, fast foods, and fried foods. For high cholesterol - Increase fiber intake (Benefiber or Metamucil, Cherrios,  oatmeal, beans, nuts, fruits  and vegetables), limit saturated fats (in fried foods, red meat), can add OTC fish oil supplement, eat fish with Omega-3 fatty acids like salmon and tuna, exercise for 30 minutes 3 - 5 times a week, drink 8 - 10 glasses of water a day. ? ?Exercise: ?Aerobic exercise helps maintain good heart health. At least 30-40 minutes of moderate-intensity exercise is recommended. For example, a brisk walk that increases your heart rate and breathing. This should be done on most days of the week.  ?Find a type of exercise or a variety of exercises that you enjoy so that it becomes a part of your daily life.  Examples are running, walking, swimming, water aerobics, and biking.  For motivation and support, explore group exercise such as aerobic class, spin class, Zumba, Yoga,or  martial arts, etc.   ?Set exercise goals for yourself, such as a certain weight goal, walk or run in a race such as a 5k walk/run.  Speak to your primary care provider about exercise goals. ? ?Disease prevention: ?If you smoke or chew tobacco, find out from your caregiver how to quit. It can literally save your life, no matter how long you have been a tobacco user. If you do not use tobacco, never begin.  ?Maintain a healthy diet and normal weight. Increased weight leads to problems with blood pressure and diabetes.  ?The Body Mass Index or BMI is a way of measuring how much of your body is fat. Having a BMI above 27 increases the risk of heart disease, diabetes, hypertension, stroke and other problems related to obesity. Your caregiver can help determine your BMI and based on it develop   an exercise and dietary program to help you achieve or maintain this important measurement at a healthful level. ?High blood pressure causes heart and blood vessel problems.  Persistent high blood pressure should be treated with medicine if weight loss and exercise do not work.  ?Fat and cholesterol leaves deposits in your arteries that can block them. This causes heart  disease and vessel disease elsewhere in your body.  If your cholesterol is found to be high, or if you have heart disease or certain other medical conditions, then you may need to have your cholesterol monitored frequently and be treated with medication.  ?Ask if you should have a stress test if your history suggests this. A stress test is a test done on a treadmill that looks for heart disease. This test can find disease prior to there being a problem. ?Avoid drinking alcohol in excess (more than two drinks per day).  Avoid use of street drugs. Do not share needles with anyone. Ask for professional help if you need assistance or instructions on stopping the use of alcohol, cigarettes, and/or drugs. ?Brush your teeth twice a day with fluoride toothpaste, and floss once a day. Good oral hygiene prevents tooth decay and gum disease. The problems can be painful, unattractive, and can cause other health problems. Visit your dentist for a routine oral and dental check up and preventive care every 6-12 months.  ?Look at your skin regularly.  Use a mirror to look at your back. Notify your caregivers of changes in moles, especially if there are changes in shapes, colors, a size larger than a pencil eraser, an irregular border, or development of new moles. ? ?Safety: ?Use seatbelts 100% of the time, whether driving or as a passenger.  Use safety devices such as hearing protection if you work in environments with loud noise or significant background noise.  Use safety glasses when doing any work that could send debris in to the eyes.  Use a helmet if you ride a bike or motorcycle.  Use appropriate safety gear for contact sports.  Talk to your caregiver about gun safety. ?Use sunscreen with a SPF (or skin protection factor) of 15 or greater.  Lighter skinned people are at a greater risk of skin cancer. Don?t forget to also wear sunglasses in order to protect your eyes from too much damaging sunlight. Damaging sunlight can  accelerate cataract formation.  ?Practice safe sex. Use condoms. Condoms are used for birth control and to help reduce the spread of sexually transmitted infections (or STIs).  Some of the STIs are gonorrhea (the clap), chlamydia, syphilis, trichomonas, herpes, HPV (human papilloma virus) and HIV (human immunodeficiency virus) which causes AIDS. The herpes, HIV and HPV are viral illnesses that have no cure. These can result in disability, cancer and death.  ?Keep carbon monoxide and smoke detectors in your home functioning at all times. Change the batteries every 6 months or use a model that plugs into the wall.  ? ?Vaccinations: ?Stay up to date with your tetanus shots and other required immunizations. You should have a booster for tetanus every 10 years. Be sure to get your flu shot every year, since 5%-20% of the [place] population comes down with the flu. The flu vaccine changes each year, so being vaccinated once is not enough. Get your shot in the fall, before the flu season peaks.   ?Other vaccines to consider: ?Pneumococcal vaccine to protect against certain types of pneumonia.  This is normally recommended for adults age   65 or older.  However, adults younger than 42 years old with certain underlying conditions such as diabetes, heart or lung disease should also receive the vaccine. ?Shingles vaccine to protect against Varicella Zoster if you are older than age 60, or younger than 42 years old with certain underlying illness. ?Hepatitis A vaccine to protect against a form of infection of the liver by a virus acquired from food. ?Hepatitis B vaccine to protect against a form of infection of the liver by a virus acquired from blood or body fluids, particularly if you work in health care. ?If you plan to travel internationally, check with your local health department for specific vaccination recommendations. ? ?Cancer Screening: ?Most routine colon cancer screening begins at the age of 50. On a yearly basis,  doctors may provide special easy to use take-home tests to check for hidden blood in the stool. Sigmoidoscopy or colonoscopy can detect the earliest forms of colon cancer and is life saving. These tests use a smal

## 2021-09-03 NOTE — Assessment & Plan Note (Signed)
controlled, eat a low fat diet, increase fiber intake (Benefiber or Metamucil, Cherrios,  oatmeal, beans, nuts, fruits and vegetables), limit saturated fats (in fried foods, red meat), can add OTC fish oil supplement, eat fish with Omega-3 fatty acids like salmon and tuna, exercise for 30 minutes 3 - 5 times a week, drink 8 - 10 glasses of water a day ? ? ?

## 2021-09-03 NOTE — Progress Notes (Addendum)
Complete physical exam   Patient: Jerry Rivera   DOB: Feb 15, 1981   41 y.o. Male  MRN: [id number] Visit Date: 09/03/2021  Chief Complaint  Patient presents with   Annual Exam    CPE fasting labs no other issues pt. Sees eye doctor and dentist yearly, [facility]   Subjective    Jerry Rivera is a 41 y.o. male who presents today for a complete physical exam.   Reports is generally feeling well; is eating a healthy diet; is sleeping well 6 - 7; drinks 4 - 5  glasses of water a day; is exercising with treadmill 4 times a week, is also walking   HPI HPI     Annual Exam    Additional comments: CPE fasting labs no other issues pt. Sees eye doctor and dentist yearly, [facility]      Last edited by Fredirick Maudlin, RMA on 09/03/2021  8:15 AM.        Past Medical History:  Diagnosis Date   Allergies    Hypertension    Palpitations    Past Surgical History:  Procedure Laterality Date   WISDOM TOOTH EXTRACTION     age 47   Social History   Socioeconomic History   Marital status: Married    Spouse name: Not on file   Number of children: 1   Years of education: Not on file   Highest education level: Not on file  Occupational History   Not on file  Tobacco Use   Smoking status: Some Days    Packs/day: 0.00    Years: 10.00    Pack years: 0.00    Types: Cigars, Cigarettes    Start date: 2010   Smokeless tobacco: Never   Tobacco comments:    max 4 cigars a year, now 1 a year 09/12/2020  Vaping Use   Vaping Use: Never used  Substance and Sexual Activity   Alcohol use: Yes    Comment: 10-14 beers weekly   Drug use: Never   Sexual activity: Yes  Other Topics Concern   Not on file  Social History Narrative   Not on file   Social Determinants of Health   Financial Resource Strain: Not on file  Food Insecurity: Not on file  Transportation Needs: Not on file  Physical Activity: Not on file  Stress: Not on file  Social Connections: Not on file   Intimate Partner Violence: Not on file   Family Status  Relation Name Status   Mother  Deceased   Father  Alive   Pat Uncle  Deceased   Neg Hx  (Not Specified)   Family History  Problem Relation Age of Onset   Colon cancer Mother    Stomach cancer Mother    Hypertension Father    Cirrhosis Paternal Uncle    Hyperlipidemia Neg Hx    CVA Neg Hx    Heart attack Neg Hx    Prostate cancer Neg Hx    Diabetes type II Neg Hx    No Known Allergies  Patient Care Team: Lexine Baton as PCP - General (Physician Assistant)   Medications: Outpatient Medications Prior to Visit  Medication Sig   fluticasone-salmeterol (ADVAIR DISKUS) 250-50 MCG/ACT AEPB Inhale 1 puff into the lungs in the morning and at bedtime.   ibuprofen (ADVIL) 200 MG tablet Take 200 mg by mouth as needed (pain).   Multiple Vitamin (MULTIVITAMIN) tablet Take 1 tablet by mouth daily.   [DISCONTINUED] albuterol (VENTOLIN  HFA) 108 (90 Base) MCG/ACT inhaler Inhale 2 puffs into the lungs every 6 (six) hours as needed for wheezing or shortness of breath. (Patient not taking: Reported on 09/03/2021)   [DISCONTINUED] metoprolol succinate (TOPROL-XL) 25 MG 24 hr tablet Take 1 tablet (25 mg total) by mouth daily. Take with or immediately following a meal.   No facility-administered medications prior to visit.    Review of Systems  Constitutional:  Negative for activity change and fever.  HENT:  Negative for congestion, ear pain and voice change.   Eyes:  Negative for redness.  Respiratory:  Negative for cough.   Cardiovascular:  Negative for chest pain.  Gastrointestinal:  Negative for constipation and diarrhea.  Endocrine: Negative for polyuria.  Genitourinary:  Negative for flank pain.  Musculoskeletal:  Negative for gait problem and neck stiffness.  Skin:  Negative for color change and rash.  Neurological:  Negative for dizziness.  Hematological:  Negative for adenopathy.  Psychiatric/Behavioral:  Negative  for agitation, behavioral problems and confusion.    Last CBC Lab Results  Component Value Date   WBC 5.3 08/09/2020   HGB 16.3 08/09/2020   HCT 48.0 08/09/2020   MCV 90 08/09/2020   MCH 30.5 08/09/2020   RDW 12.1 08/09/2020   PLT 223 08/09/2020   Last metabolic panel Lab Results  Component Value Date   GLUCOSE 99 08/09/2020   NA 140 08/09/2020   K 5.0 08/09/2020   CL 99 08/09/2020   CO2 23 08/09/2020   BUN 13 08/09/2020   CREATININE 0.96 08/09/2020   EGFR 102 08/09/2020   CALCIUM 9.6 08/09/2020   PROT 7.8 08/09/2020   ALBUMIN 5.1 (H) 08/09/2020   LABGLOB 2.7 08/09/2020   AGRATIO 1.9 08/09/2020   BILITOT 0.6 08/09/2020   ALKPHOS 78 08/09/2020   AST 23 08/09/2020   ALT 22 08/09/2020   Last lipids Lab Results  Component Value Date   CHOL 267 (H) 08/09/2020   HDL 76 08/09/2020   LDLCALC 181 (H) 08/09/2020   TRIG 62 08/09/2020   CHOLHDL 3.5 08/09/2020   Last hemoglobin A1c No results found for: HGBA1C Last thyroid functions Lab Results  Component Value Date   TSH 1.670 08/09/2020   Last vitamin D No results found for: 25OHVITD2, 25OHVITD3, VD25OH Last vitamin B12 and Folate No results found for: VITAMINB12, FOLATE    The 10-year ASCVD risk score (Arnett DK, et al., 2019) is: 3.5%   Objective    BP 120/84   Pulse 75   Temp 98.5 F (36.9 C)   Ht 5\' 10"  (1.778 m)   Wt 141 lb 12.8 oz (64.3 kg)   BMI 20.35 kg/m       Physical Exam Vitals and nursing note reviewed.  Constitutional:      General: He is not in acute distress.    Appearance: Normal appearance.  HENT:     Head: Normocephalic and atraumatic.     Right Ear: Tympanic membrane, ear canal and external ear normal.     Left Ear: Tympanic membrane, ear canal and external ear normal.     Nose: No congestion.  Eyes:     Extraocular Movements: Extraocular movements intact.     Conjunctiva/sclera: Conjunctivae normal.     Pupils: Pupils are equal, round, and reactive to light.  Neck:      Vascular: No carotid bruit.  Cardiovascular:     Rate and Rhythm: Normal rate and regular rhythm.     Pulses: Normal pulses.  Heart sounds: Normal heart sounds.  Pulmonary:     Effort: Pulmonary effort is normal.     Breath sounds: Normal breath sounds. No wheezing.  Abdominal:     General: Bowel sounds are normal.     Palpations: Abdomen is soft.  Musculoskeletal:        General: Normal range of motion.     Cervical back: Normal range of motion and neck supple.     Right lower leg: No edema.     Left lower leg: No edema.  Skin:    General: Skin is warm and dry.     Findings: No rash.  Neurological:     Mental Status: He is alert and oriented to person, place, and time.     Gait: Gait normal.  Psychiatric:        Mood and Affect: Mood normal.        Behavior: Behavior normal.      Last depression screening scores    09/03/2021    8:15 AM 08/09/2020   10:11 AM 07/29/2019    9:15 AM  PHQ 2/9 Scores  PHQ - 2 Score 0 0 0   Last fall risk screening    09/03/2021    8:14 AM  Fall Risk   Falls in the past year? 0  Number falls in past yr: 0  Injury with Fall? 0  Risk for fall due to : No Fall Risks  Follow up Falls evaluation completed     No results found for any visits on 09/03/21.  Assessment & Plan    Routine Health Maintenance and Physical Exam  Exercise Activities and Dietary recommendations  Goals   None     Immunization History  Administered Date(s) Administered   PFIZER(Purple Top)SARS-COV-2 Vaccination 07/16/2019, 08/06/2019, 03/23/2020   Tdap 10/01/2010    Health Maintenance  Topic Date Due   COVID-19 Vaccine (4 - Booster for Pfizer series) 05/18/2020   TETANUS/TDAP  09/30/2020   HIV Screening  09/04/2022 (Originally 11/11/1994)   INFLUENZA VACCINE  11/20/2021   Hepatitis C Screening  Completed   HPV VACCINES  Aged Out    Discussed health benefits of physical activity, and encouraged him to engage in regular exercise appropriate for his  age and condition.  Problem List Items Addressed This Visit       Other   Elevated lipids    controlled, eat a low fat diet, increase fiber intake (Benefiber or Metamucil, Cherrios,  oatmeal, beans, nuts, fruits and vegetables), limit saturated fats (in fried foods, red meat), can add OTC fish oil supplement, eat fish with Omega-3 fatty acids like salmon and tuna, exercise for 30 minutes 3 - 5 times a week, drink 8 - 10 glasses of water a day        Family history of colon cancer in mother   Relevant Orders   Ambulatory referral to Gastroenterology   Body mass index (BMI) of 20.0-20.9 in adult    Stable, will monitor       Other Visit Diagnoses     Routine medical exam    -  Primary   Relevant Orders   CBC with Differential/Platelet   Comprehensive metabolic panel   Lipid panel        Return in about 1 year (around 09/04/2022) for Return for Annual Exam with PCP Mayford Knife.     Jake Shark, PA-C

## 2021-09-03 NOTE — Assessment & Plan Note (Signed)
Stable, will monitor 

## 2021-09-04 LAB — COMPREHENSIVE METABOLIC PANEL
ALT: 16 IU/L (ref 0–44)
AST: 20 IU/L (ref 0–40)
Albumin/Globulin Ratio: 2.1 (ref 1.2–2.2)
Albumin: 4.8 g/dL (ref 4.0–5.0)
Alkaline Phosphatase: 67 IU/L (ref 44–121)
BUN/Creatinine Ratio: 15 (ref 9–20)
BUN: 12 mg/dL (ref 6–24)
Bilirubin Total: 0.4 mg/dL (ref 0.0–1.2)
CO2: 25 mmol/L (ref 20–29)
Calcium: 9.6 mg/dL (ref 8.7–10.2)
Chloride: 104 mmol/L (ref 96–106)
Creatinine, Ser: 0.79 mg/dL (ref 0.76–1.27)
Globulin, Total: 2.3 g/dL (ref 1.5–4.5)
Glucose: 94 mg/dL (ref 70–99)
Potassium: 4.9 mmol/L (ref 3.5–5.2)
Sodium: 142 mmol/L (ref 134–144)
Total Protein: 7.1 g/dL (ref 6.0–8.5)
eGFR: 114 mL/min/{1.73_m2} (ref >59)

## 2021-09-04 LAB — CBC WITH DIFFERENTIAL/PLATELET
Basophils Absolute: 0 10*3/uL (ref 0.0–0.2)
Basos: 0 %
EOS (ABSOLUTE): 0.1 10*3/uL (ref 0.0–0.4)
Eos: 1 %
Hematocrit: 46 % (ref 37.5–51.0)
Hemoglobin: 15.5 g/dL (ref 13.0–17.7)
Immature Grans (Abs): 0 10*3/uL (ref 0.0–0.1)
Immature Granulocytes: 0 %
Lymphocytes Absolute: 1.5 10*3/uL (ref 0.7–3.1)
Lymphs: 27 %
MCH: 30.6 pg (ref 26.6–33.0)
MCHC: 33.7 g/dL (ref 31.5–35.7)
MCV: 91 fL (ref 79–97)
Monocytes Absolute: 0.4 10*3/uL (ref 0.1–0.9)
Monocytes: 7 %
Neutrophils Absolute: 3.6 10*3/uL (ref 1.4–7.0)
Neutrophils: 65 %
Platelets: 202 10*3/uL (ref 150–450)
RBC: 5.06 x10E6/uL (ref 4.14–5.80)
RDW: 12.1 % (ref 11.6–15.4)
WBC: 5.5 10*3/uL (ref 3.4–10.8)

## 2021-09-04 LAB — LIPID PANEL
Chol/HDL Ratio: 3 ratio (ref 0.0–5.0)
Cholesterol, Total: 234 mg/dL — ABNORMAL HIGH (ref 100–199)
HDL: 78 mg/dL (ref >39)
LDL Chol Calc (NIH): 144 mg/dL — ABNORMAL HIGH (ref 0–99)
Triglycerides: 73 mg/dL (ref 0–149)
VLDL Cholesterol Cal: 12 mg/dL (ref 5–40)

## 2021-10-15 ENCOUNTER — Encounter: Payer: Self-pay | Admitting: Physician Assistant

## 2021-10-19 ENCOUNTER — Encounter: Payer: Self-pay | Admitting: Internal Medicine

## 2021-12-26 ENCOUNTER — Encounter: Payer: Self-pay | Admitting: Internal Medicine

## 2022-01-29 ENCOUNTER — Encounter: Payer: Self-pay | Admitting: Internal Medicine

## 2022-03-05 ENCOUNTER — Encounter: Payer: Self-pay | Admitting: Internal Medicine

## 2022-03-13 ENCOUNTER — Other Ambulatory Visit: Payer: Self-pay | Admitting: Pulmonary Disease

## 2022-03-13 NOTE — Telephone Encounter (Signed)
Any future refills requires a appointment  

## 2022-06-08 IMAGING — CT CT CHEST W/O CM
2 of 4 series · 15 of 36 positions shown, 18 images · non-contrast
Comparison: Chest radiograph 07/14/2020

CLINICAL DATA: Wheezing.  Abnormal x-ray with lung opacity.

EXAM:
CT CHEST WITHOUT CONTRAST
TECHNIQUE: Multidetector CT imaging of the chest was performed following the
standard protocol without IV contrast.

[Series 2: thorax · axial · 0.62mm/px · z∈[-363,-55]mm · 12 of 183 slices shown, 15 images]
[im 15/183  mediastinal]
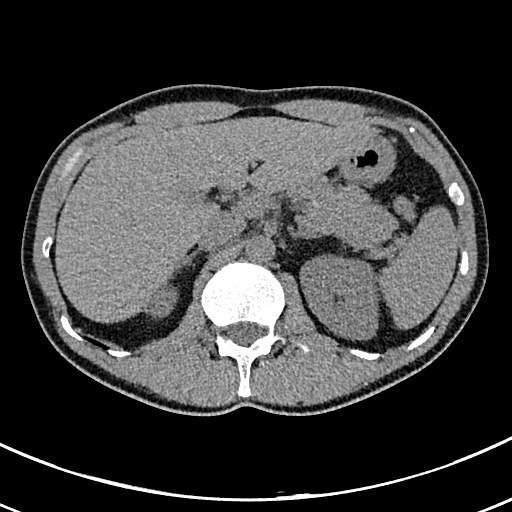
[im 15/183  lung]
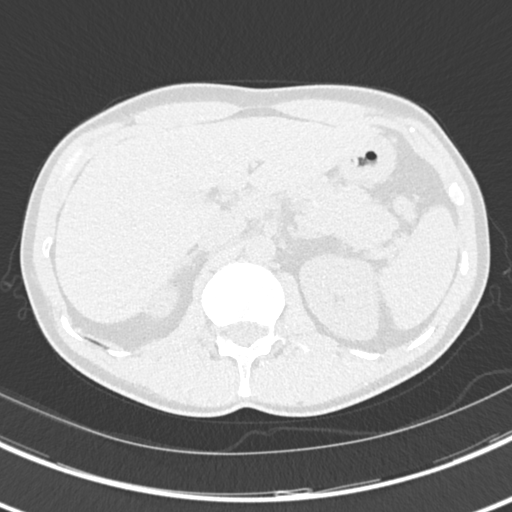
[im 29/183  lung]
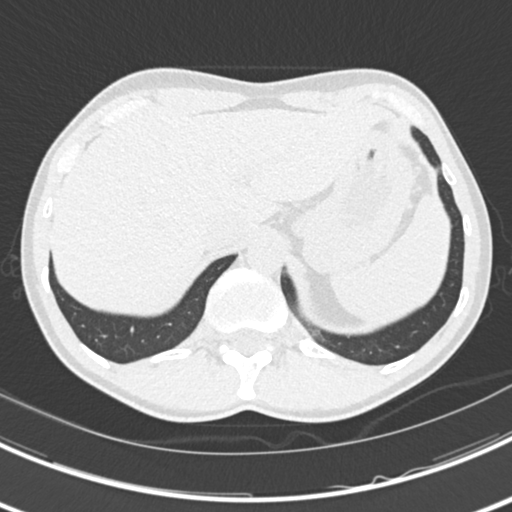
[im 43/183  lung]
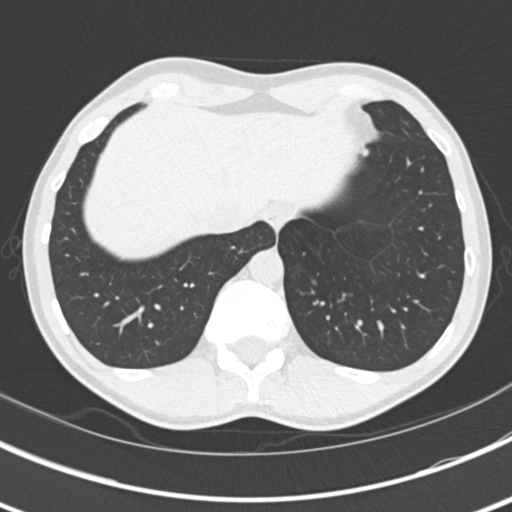
[im 57/183  lung]
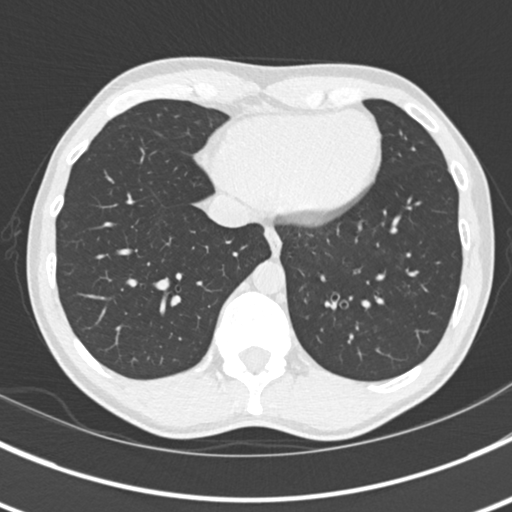
[im 71/183  mediastinal]
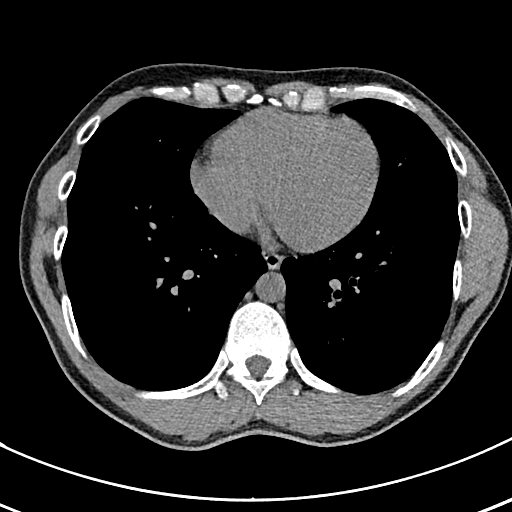
[im 71/183  lung]
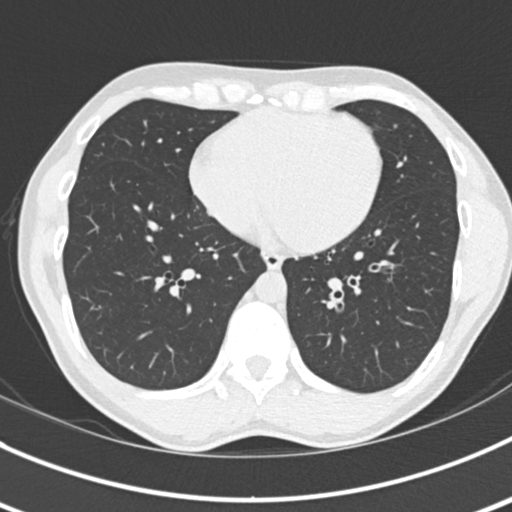
[im 85/183  lung]
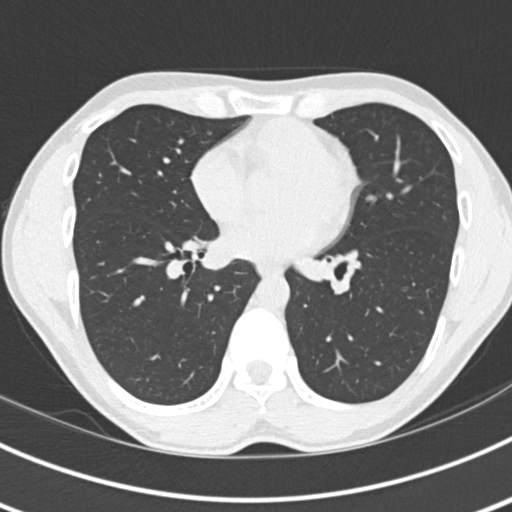
[im 99/183  lung]
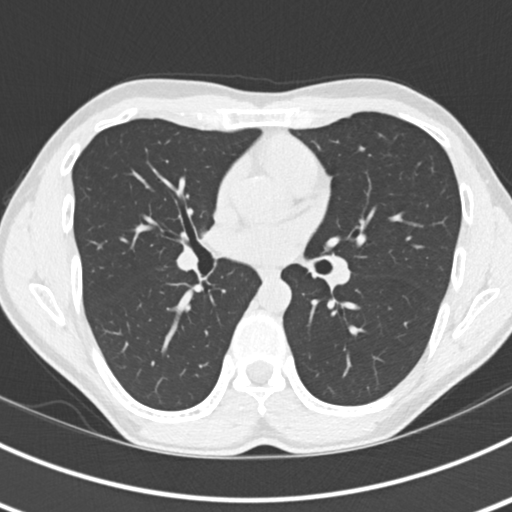
[im 113/183  lung]
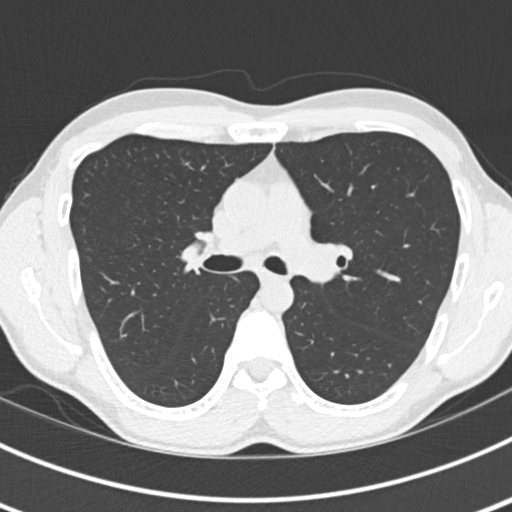
[im 127/183  mediastinal]
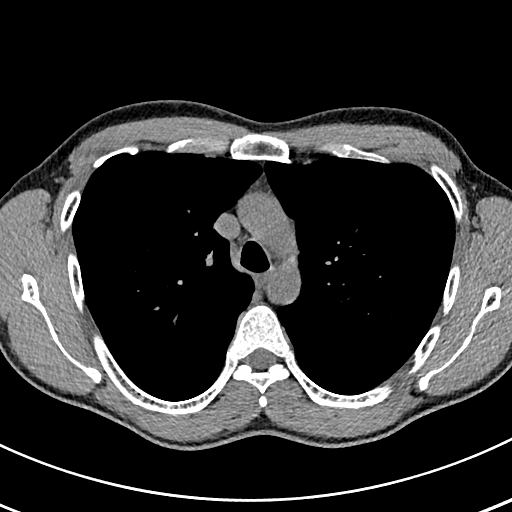
[im 127/183  lung]
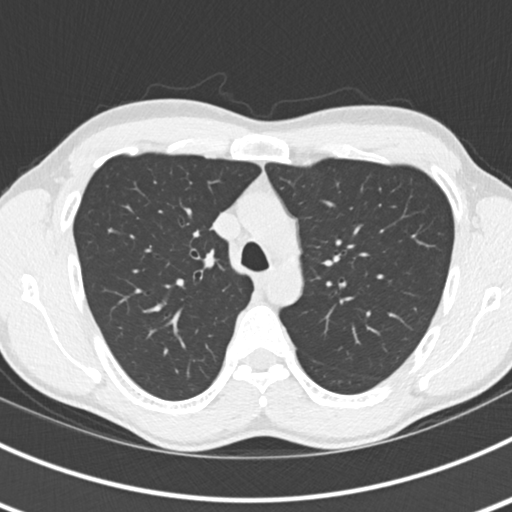
[im 141/183  lung]
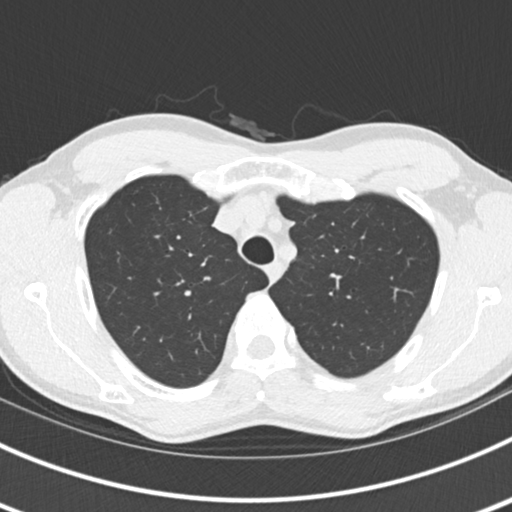
[im 155/183  lung]
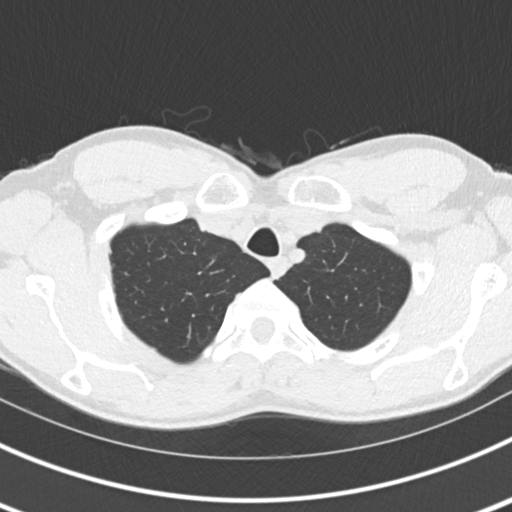
[im 169/183  lung]
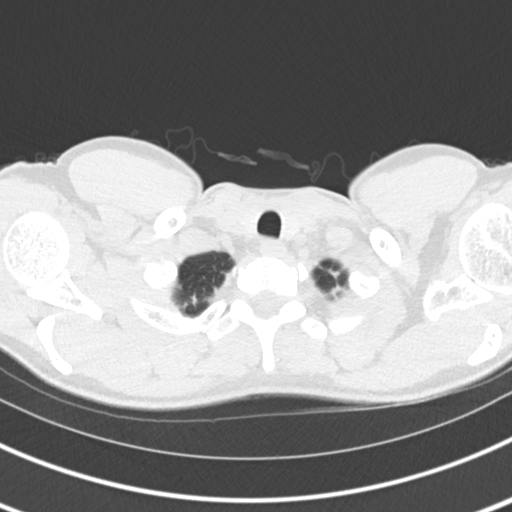

[Series 5: coronal · coronal · 0.60mm/px · 3 of 112 slices shown]
[im 23/112  lung]
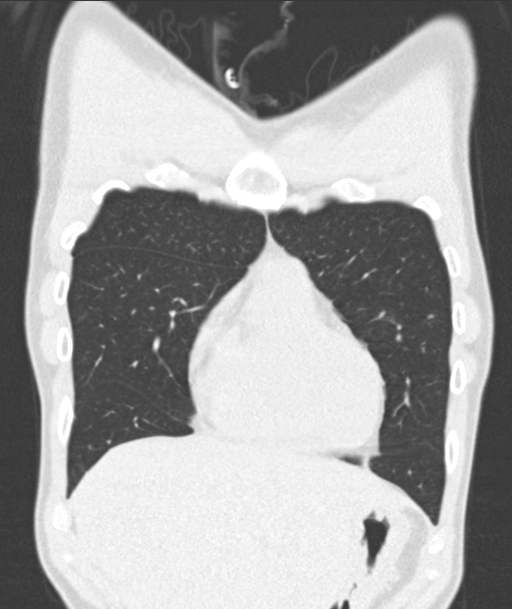
[im 45/112  lung]
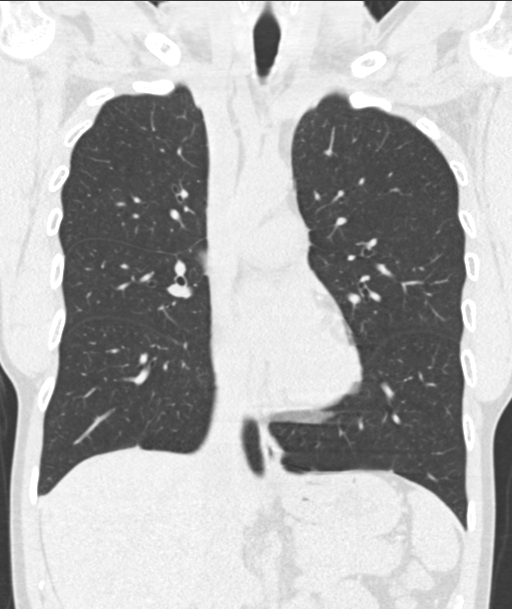
[im 67/112  lung]
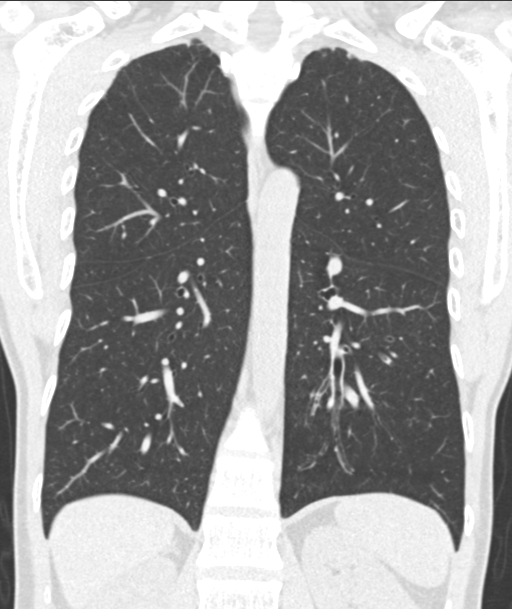

[15 of 36 positions shown; findings below may reference images not displayed]

FINDINGS: Cardiovascular: Normal heart size. Normal caliber thoracic aorta.
Normal caliber pulmonary artery. Occasional coronary artery
calcifications. No pericardial effusion.

Mediastinum/Nodes: No enlarged mediastinal lymph nodes. No bulky
hilar adenopathy on this noncontrast exam. No suspicious thyroid
nodule. Decompressed esophagus.

Lungs/Pleura: Mild hyperinflation is seen on prior radiograph. Left
greater than right bronchial thickening with occasional areas of
mucous plugging in the left lower lobe. Minimal faint
peribronchovascular airspace disease in the left lower lobe in the
region of mucous plugging is likely postobstructive. 3 mm lingular
nodule, series 3, image 96. No consolidation. Minimal biapical
pleuroparenchymal scarring. No pleural effusion. No pulmonary mass.

Upper Abdomen: No acute or unexpected finding.

Musculoskeletal: Sclerotic focus in the right glenoid is likely a
bone island. There are no acute or suspicious osseous abnormalities.
No chest wall soft tissue abnormality
IMPRESSION: 1. Left greater than right bronchial thickening with occasional
areas of mucous plugging in the left lower lobe. Minimal faint
peribronchovascular airspace disease in the left lower lobe in the
region of mucous plugging is likely postobstructive.
2. Mild hyperinflation, can be seen with smoking, asthma,
bronchitis.
3. A 3 mm lingular nodule is nonspecific. No follow-up needed if
patient is low-risk. Non-contrast chest CT can be considered in 12
months if patient is high-risk. This recommendation follows the
consensus statement: Guidelines for Management of Incidental
Pulmonary Nodules Detected on CT Images: From the [HOSPITAL]
4. Occasional coronary artery calcifications.

## 2022-09-09 ENCOUNTER — Encounter: Payer: 59 | Admitting: Physician Assistant

## 2022-09-29 ENCOUNTER — Other Ambulatory Visit: Payer: Self-pay | Admitting: Pulmonary Disease

## 2022-11-20 ENCOUNTER — Other Ambulatory Visit: Payer: Self-pay | Admitting: Pulmonary Disease

## 2022-11-25 NOTE — Telephone Encounter (Signed)
Pt calling in for a refill for [facility]  Pharmacy: [facility] [id number] - [place], [place] - [address]

## 2022-11-26 NOTE — Telephone Encounter (Signed)
Per chart patient has not been prescribed Wixela, in current or historical meds. There is also no documentation regarding a med change from Advair to [facility].   Per [facility] they are dispensing Wixela to patient instead of Advair. Last dispense was 07/05/22. Pharmacy advised to send refill requests to PCP.  Spoke with patient and he is aware he needs an OV for further refills, not seen since 10/2020. Routing message to admin staff to schedule OV.   Thanks!

## 2022-12-09 ENCOUNTER — Encounter: Payer: Self-pay | Admitting: Nurse Practitioner

## 2022-12-09 ENCOUNTER — Ambulatory Visit: Payer: 59 | Admitting: Nurse Practitioner

## 2022-12-09 VITALS — BP 124/78 | HR 67 | Temp 98.4°F | Ht 69.0 in | Wt 141.0 lb

## 2022-12-09 DIAGNOSIS — J45991 Cough variant asthma: Secondary | ICD-10-CM | POA: Diagnosis not present

## 2022-12-09 DIAGNOSIS — J479 Bronchiectasis, uncomplicated: Secondary | ICD-10-CM | POA: Diagnosis not present

## 2022-12-09 NOTE — Assessment & Plan Note (Signed)
Chronic cough with significant improvement after initiating maintenance therapy with ICS/LABA. He is compensated on current regimen. Will trial de-escalation to once daily dosing. If he does well on this, could consider de-escalating further to lower dose ICS. Will re-evaluate at follow up. Action plan in place.   Patient Instructions  Continue Advair 1 puff once daily. Brush tongue and rinse mouth afterwards. Increase to ONE puff TWICE a day for 3 days and use flutter valve 5-10x a day for mucous clearance if you're having more chest congestion or cough. If you do well on the once daily dosing, we may decrease your inhaled steroid dosing at your follow up Continue Albuterol inhaler 2 puffs every 6 hours as needed for shortness of breath or wheezing  Follow up in 3 months with Dr. Everardo All (1st) or Katie Laine Giovanetti,NP. If symptoms worsen, please contact office for sooner follow up or seek emergency care.

## 2022-12-09 NOTE — Assessment & Plan Note (Addendum)
No exacerbations. Compensated. Continue mucociliary clearance. Immunoglobulin testing unremarkable. See above.

## 2022-12-09 NOTE — Patient Instructions (Addendum)
Continue Advair 1 puff once daily. Brush tongue and rinse mouth afterwards. Increase to ONE puff TWICE a day for 3 days and use flutter valve 5-10x a day for mucous clearance if you're having more chest congestion or cough. If you do well on the once daily dosing, we may decrease your inhaled steroid dosing at your follow up Continue Albuterol inhaler 2 puffs every 6 hours as needed for shortness of breath or wheezing  Follow up in 3 months with Dr. Everardo All (1st) or Katie Jenell Dobransky,NP. If symptoms worsen, please contact office for sooner follow up or seek emergency care.

## 2022-12-09 NOTE — Progress Notes (Signed)
@Patient  ID: Jerry Rivera, male    DOB: Jun 04, 1980, 42 y.o.   MRN: [id number]  Chief Complaint  Patient presents with   Follow-up    Pt is here for Medication Refill    Referring provider: No ref. provider found  HPI: 42 year old male, former smoker followed for chronic cough and bronchiectasis. He is a patient of Dr. George Hugh and last seen in office 10/31/2022. Past medical history significant for HTN, GERD.   TEST/EVENTS:  09/20/2020 CT chest: Decompressed esophagus.  Mild hyperinflation.  Left greater than right bronchial thickening with areas of mucous plugging in left lower lobe.  Minimal faint parabronchial vascular airspace disease in the left lower lobe, likely postobstructive.  3 mm lingular nodule.  Mild biapical pleural-parenchymal scarring. 10/30/2020 PFT: FVC 92, FEV1 96, ratio 86, TLC 106, DLCO 98.  Normal pulmonary function testing  10/30/2020: OV with Dr. Everardo All.  5-year history of cough.  Started on [place] with improvement.  Switched Advair due to insurance.  Cough seems to be improved and less productive.  Able to clear his throat without significant sputum production.  Rarely uses albuterol.  He has been able to start jogging.  No wheezing.  On bad days, has to be more forceful with his cough.  CT chest showed mild bronchiectasis with mucous plugging.  PFTs were normal.  Focus on airway clearance and minimizing respiratory infections.  Advised to use Advair 1 puff once a day.  Increase to twice daily for 3 days for worsening symptoms and use flutter valve.  Immunoglobulin panel ordered and unremarkable.  12/09/2022: Today-follow-up Patient presents today for overdue follow-up.  Has been doing well since he was here last.  Has not required any steroids or antibiotics for his breathing.  He does not remember the last time that he had to use his albuterol.  Cough seems to be well-managed and minimal.  Does not have much sputum production.  Not having any trouble with his  breathing.  No wheezing or chest congestion.  He is currently using the Advair twice daily.  No Known Allergies  Immunization History  Administered Date(s) Administered   PFIZER(Purple Top)SARS-COV-2 Vaccination 07/16/2019, 08/06/2019, 03/23/2020   Tdap 10/01/2010    Past Medical History:  Diagnosis Date   Allergies    Hypertension    Palpitations     Tobacco History: Social History   Tobacco Use  Smoking Status Former   Types: Cigars, Cigarettes   Start date: 2010  Smokeless Tobacco Never  Tobacco Comments   max 4 cigars a year, now 1 a year 09/12/2020   Counseling given: Not Answered Tobacco comments: max 4 cigars a year, now 1 a year 09/12/2020   Outpatient Medications Prior to Visit  Medication Sig Dispense Refill   ADVAIR DISKUS 250-50 MCG/ACT AEPB INHALE 1 PUFF INTO THE LUNGS IN THE MORNING AND AT BEDTIME. 60 each 5   ibuprofen (ADVIL) 200 MG tablet Take 200 mg by mouth as needed (pain).     Multiple Vitamin (MULTIVITAMIN) tablet Take 1 tablet by mouth daily.     No facility-administered medications prior to visit.     Review of Systems:   Constitutional: No weight loss or gain, night sweats, fevers, chills, fatigue, or lassitude. HEENT: No headaches, difficulty swallowing, tooth/dental problems, or sore throat. No sneezing, itching, ear ache, nasal congestion, or post nasal drip CV:  No chest pain, orthopnea, PND, swelling in lower extremities, anasarca, dizziness, palpitations, syncope Resp: +minimal cough. No shortness of breath with exertion  or at rest. No excess mucus or change in color of mucus. No hemoptysis. No wheezing.  No chest wall deformity GI:  No heartburn, indigestion Skin: No rash, lesions, ulcerations MSK:  No joint pain or swelling.   Neuro: No dizziness or lightheadedness.  Psych: No depression or anxiety. Mood stable.     Physical Exam:  BP 124/78 (BP Location: Right Arm, Cuff Size: Normal)   Pulse 67   Temp 98.4 F (36.9 C) (Oral)    Ht 5\' 9"  (1.753 m)   Wt 141 lb (64 kg)   SpO2 96%   BMI 20.82 kg/m   GEN: Pleasant, interactive, well-appearing; in no acute distress. HEENT:  Normocephalic and atraumatic. PERRLA. Sclera white. Nasal turbinates pink, moist and patent bilaterally. No rhinorrhea present. Oropharynx pink and moist, without exudate or edema. No lesions, ulcerations, or postnasal drip.  NECK:  Supple w/ fair ROM. No JVD present. Normal carotid impulses w/o bruits. Thyroid symmetrical with no goiter or nodules palpated. No lymphadenopathy.   CV: RRR, no m/r/g, no peripheral edema. Pulses intact, +2 bilaterally. No cyanosis, pallor or clubbing. PULMONARY:  Unlabored, regular breathing. Clear bilaterally A&P w/o wheezes/rales/rhonchi. No accessory muscle use.  GI: BS present and normoactive. Soft, non-tender to palpation. No organomegaly or masses detected. MSK: No erythema, warmth or tenderness. Cap refil <2 sec all extrem. No deformities or joint swelling noted.  Neuro: A/Ox3. No focal deficits noted.   Skin: Warm, no lesions or rashe Psych: Normal affect and behavior. Judgement and thought content appropriate.     Lab Results:  CBC    Component Value Date/Time   WBC 5.5 09/03/2021 0842   RBC 5.06 09/03/2021 0842   HGB 15.5 09/03/2021 0842   HCT 46.0 09/03/2021 0842   PLT 202 09/03/2021 0842   MCV 91 09/03/2021 0842   MCH 30.6 09/03/2021 0842   MCHC 33.7 09/03/2021 0842   RDW 12.1 09/03/2021 0842   LYMPHSABS 1.5 09/03/2021 0842   EOSABS 0.1 09/03/2021 0842   BASOSABS 0.0 09/03/2021 0842    BMET    Component Value Date/Time   NA 142 09/03/2021 0842   K 4.9 09/03/2021 0842   CL 104 09/03/2021 0842   CO2 25 09/03/2021 0842   GLUCOSE 94 09/03/2021 0842   BUN 12 09/03/2021 0842   CREATININE 0.79 09/03/2021 0842   CALCIUM 9.6 09/03/2021 0842   GFRNONAA 106 06/24/2019 1153   GFRAA 122 06/24/2019 1153    BNP No results found for: "BNP"   Imaging:  No results found.  Administration  History     None          Latest Ref Rng & Units 10/30/2020   10:04 AM  PFT Results  FVC-Pre L 4.74   FVC-Predicted Pre % 92   FVC-Post L 4.78   FVC-Predicted Post % 93   Pre FEV1/FVC % % 83   Post FEV1/FCV % % 86   FEV1-Pre L 3.94   FEV1-Predicted Pre % 96   FEV1-Post L 4.09   DLCO uncorrected ml/min/mmHg 29.75   DLCO UNC% % 98   DLCO corrected ml/min/mmHg 29.75   DLCO COR %Predicted % 98   DLVA Predicted % 101   TLC L 7.18   TLC % Predicted % 106   RV % Predicted % 139     No results found for: "NITRICOXIDE"      Assessment & Plan:   Cough variant asthma Chronic cough with significant improvement after initiating maintenance therapy with ICS/LABA. He  is compensated on current regimen. Will trial de-escalation to once daily dosing. If he does well on this, could consider de-escalating further to lower dose ICS. Will re-evaluate at follow up. Action plan in place.   Patient Instructions  Continue Advair 1 puff once daily. Brush tongue and rinse mouth afterwards. Increase to ONE puff TWICE a day for 3 days and use flutter valve 5-10x a day for mucous clearance if you're having more chest congestion or cough. If you do well on the once daily dosing, we may decrease your inhaled steroid dosing at your follow up Continue Albuterol inhaler 2 puffs every 6 hours as needed for shortness of breath or wheezing  Follow up in 3 months with Dr. Everardo All (1st) or Katie Nelma Phagan,NP. If symptoms worsen, please contact office for sooner follow up or seek emergency care.    Bronchiectasis without complication (HCC) No exacerbations. Compensated. Continue mucociliary clearance. Immunoglobulin testing unremarkable. See above.    I spent 28 minutes of dedicated to the care of this patient on the date of this encounter to include pre-visit review of records, face-to-face time with the patient discussing conditions above, post visit ordering of testing, clinical documentation with the  electronic health record, making appropriate referrals as documented, and communicating necessary findings to members of the patients care team.  Noemi Chapel, NP 12/09/2022  Pt aware and understands NP's role.

## 2022-12-11 ENCOUNTER — Telehealth: Payer: Self-pay | Admitting: Nurse Practitioner

## 2022-12-11 DIAGNOSIS — G4733 Obstructive sleep apnea (adult) (pediatric): Secondary | ICD-10-CM

## 2022-12-11 DIAGNOSIS — J45991 Cough variant asthma: Secondary | ICD-10-CM

## 2022-12-11 DIAGNOSIS — J479 Bronchiectasis, uncomplicated: Secondary | ICD-10-CM

## 2022-12-11 NOTE — Telephone Encounter (Signed)
Patient was in office Monday August 19. Patient is calling in regards to his prescription Advir. His pharmacy doesn't have a record of this prescription. Please advise. [facility] [address] [place]

## 2022-12-12 MED ORDER — ALBUTEROL SULFATE HFA 108 (90 BASE) MCG/ACT IN AERS: 2.0000 | INHALATION_SPRAY | Freq: Four times a day (QID) | RESPIRATORY_TRACT | 2 refills | Status: DC | PRN

## 2022-12-12 MED ORDER — FLUTICASONE-SALMETEROL 250-50 MCG/ACT IN AEPB: 1.0000 | INHALATION_SPRAY | Freq: Two times a day (BID) | RESPIRATORY_TRACT | 5 refills | Status: DC

## 2022-12-12 MED ORDER — ALBUTEROL SULFATE HFA 108 (90 BASE) MCG/ACT IN AERS: 2.0000 | INHALATION_SPRAY | Freq: Four times a day (QID) | RESPIRATORY_TRACT | 4 refills | Status: AC | PRN

## 2022-12-12 NOTE — Telephone Encounter (Signed)
Let patient know I sent medication for Advair and Albuterol sent into his pharmacy of choice . Patient's voice was understanding. Nothing else further needed.

## 2022-12-12 NOTE — Telephone Encounter (Signed)
Pt calling to check on Advair & Albuterol prescriptions  Pharmacy : [facility] [address] [place]

## 2022-12-12 NOTE — Addendum Note (Signed)
Addended by: Lanna Poche on: 12/12/2022 02:32 PM   Modules accepted: Orders

## 2023-03-18 ENCOUNTER — Ambulatory Visit (HOSPITAL_BASED_OUTPATIENT_CLINIC_OR_DEPARTMENT_OTHER): Payer: 59 | Admitting: Pulmonary Disease

## 2023-03-18 ENCOUNTER — Encounter (HOSPITAL_BASED_OUTPATIENT_CLINIC_OR_DEPARTMENT_OTHER): Payer: Self-pay | Admitting: Pulmonary Disease

## 2023-03-18 VITALS — BP 120/72 | HR 64 | Resp 16 | Ht 69.0 in | Wt 138.6 lb

## 2023-03-18 DIAGNOSIS — J45991 Cough variant asthma: Secondary | ICD-10-CM

## 2023-03-18 DIAGNOSIS — J479 Bronchiectasis, uncomplicated: Secondary | ICD-10-CM

## 2023-03-18 DIAGNOSIS — Z23 Encounter for immunization: Secondary | ICD-10-CM

## 2023-03-18 NOTE — Progress Notes (Signed)
Subjective:   PATIENT ID: Jerry Rivera GENDER: male DOB: 05/02/1980, MRN: [id number]   HPI  Chief Complaint  Patient presents with   Follow-up    Breathing has been good since last visit.     Reason for Visit: Follow-up   [name] is a 42 year old male with HTN who presents for chronic cough.  Synopsis: Five year history of cough that has worsened in the last year. Has been treated with antibiotics, reflux regimen (2 weeks) and allergy regimen (xyzol x 1 month).  2022- Started on Breo with improvement. Switched to Advair due to insurance.  He reports that since starting Breo he noticed that his cough has improved and is less productive. Able to clear his throat without significant sputum production. He rarely uses his albuterol since the maintenance inhaler is more effective. He is able to start jogging. Had to switch to fluticasone-salmeterol which seems to be effective however has only been on it for a week. He did have a period of missing inhaler for a day and has noticed return of sputum production. On his bad days he will need to be more forceful with his coughing. No wheezing.  03/18/23 Patient was lost to follow-up for two years. When he ran out of his medications he developed worsening sputum production. He re-established care in August 2024 and restarted on ICS/LABA. Insurance pays for [employer]. Currently taking it 250-50 mcg 1-2 puffs daily depending on severity of his symptoms. Last had a cold earlier this year and will take up to 2 weeks to recover. Infrequently producing sputum now. This is significantly improved compared to a few years ago.  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 Smokes one cigar a year. Denies vaping. Both parents were smokers in the home and car, using at least a carton a  week. No formal diagnosis of asthma in the family.   Past Medical History:  Diagnosis Date   Allergies    Hypertension    Palpitations     No Known Allergies   Outpatient Medications Prior to Visit  Medication Sig Dispense Refill   albuterol (VENTOLIN HFA) 108 (90 Base) MCG/ACT inhaler Inhale 2 puffs into the lungs every 6 (six) hours as needed for wheezing or shortness of breath. 8 g 4   fluticasone-salmeterol (ADVAIR DISKUS) 250-50 MCG/ACT AEPB Inhale 1 puff into the lungs 2 (two) times daily. in the morning and at bedtime. 60 each 5   ibuprofen (ADVIL) 200 MG tablet Take 200 mg by mouth as needed (pain).     Multiple Vitamin (MULTIVITAMIN) tablet Take 1 tablet by mouth daily.     No facility-administered medications prior to visit.    Review of Systems  Constitutional:  Negative for chills, diaphoresis, fever, malaise/fatigue and weight loss.  HENT:  Negative for congestion.   Respiratory:  Positive for cough and sputum production. Negative for hemoptysis, shortness of breath and wheezing.   Cardiovascular:  Negative for chest pain, palpitations and leg swelling.     Objective:   Vitals:   03/18/23 0944  BP: 120/72  Pulse: 64  Resp: 16  SpO2: 98%  Weight: 138 lb 9.6 oz (62.9 kg)  Height: 5\' 9"  (1.753 m)   SpO2: 98 %  Physical Exam: General: Well-appearing, no acute distress HENT: , AT Eyes: EOMI, no scleral  icterus Respiratory: Clear to auscultation bilaterally.  No crackles, wheezing or rales Cardiovascular: RRR, -M/R/G, no JVD Extremities:-Edema,-tenderness Neuro: AAO x4, CNII-XII grossly intact Psych: Normal mood, normal affect  Data Reviewed:  Imaging: CXR 07/14/20 - Hyperinflation  CT Chest 09/20/20 - Mild bronchiectasis with mucous plugging  PFT: 10/30/20 FVC 4.78 (93%) FEV1 4.09 (99%) Ratio 83  TLC 106% DLCO 98% Interpretation: Normal spirometry. Normal lung volumes. Normal diffusion capacity. No significant bronchodilator response.  Labs: CBC     Component Value Date/Time   WBC 5.5 09/03/2021 0842   RBC 5.06 09/03/2021 0842   HGB 15.5 09/03/2021 0842   HCT 46.0 09/03/2021 0842   PLT 202 09/03/2021 0842   MCV 91 09/03/2021 0842   MCH 30.6 09/03/2021 0842   MCHC 33.7 09/03/2021 0842   RDW 12.1 09/03/2021 0842   LYMPHSABS 1.5 09/03/2021 0842   EOSABS 0.1 09/03/2021 0842   BASOSABS 0.0 09/03/2021 0842   Absolute eos 08/09/20 100   Assessment & Plan:   Discussion: 42 year old male with bronchiectasis who presents for follow-up. Improved symptom control on ICS/LABA. Discussed clinical course and management of bronchiectasis including bronchodilator regimen, preventive care including vaccinations and action plan for exacerbation especially if prolonged. Goal is to focus on airway clearance and minimizing respiratory infections.  Mild Bronchiectasis without complication Chronic cough - improved --CONTINUE Wixela ONE puff TWICE a day. This is your EVERYDAY inhaler --CONTINUE Albuterol as needed for shortness of breath or wheezing. This is your RESCUE inhaler --Mucinex as needed  Health Maintenance Immunization History  Administered Date(s) Administered   Influenza, Seasonal, Injecte, Preservative Fre 03/18/2023   PFIZER(Purple Top)SARS-COV-2 Vaccination 07/16/2019, 08/06/2019, 03/23/2020   Tdap 10/01/2010   CT Lung Screen - not indicated  Orders Placed This Encounter  Procedures   Flu vaccine trivalent PF, 6mos and older(Flulaval,Afluria,Fluarix,Fluzone)   Pulmonary function test    Standing Status:   Future    Standing Expiration Date:   03/17/2024    Order Specific Question:   Where should this test be performed?    Answer:   [facility]    Order Specific Question:   Full PFT: includes the following: basic spirometry, spirometry pre & post bronchodilator, diffusion capacity (DLCO), lung volumes    Answer:   Full PFT   No orders of the defined types were placed in this encounter.   Return in about 3 months  (around 06/18/2023) for after PFT.  I have spent a total time of 30-minutes on the day of the appointment including chart review, data review, collecting history, coordinating care and discussing medical diagnosis and plan with the patient/family. Past medical history, allergies, medications were reviewed. Pertinent imaging, labs and tests included in this note have been reviewed and interpreted independently by me.   Jerry Strauss Mechele Collin, MD [facility] 03/18/2023 10:17 AM  Office Number [phone]

## 2023-03-18 NOTE — Patient Instructions (Addendum)
Mild Bronchiectasis without complication Chronic cough - improved --CONTINUE Wixela ONE puff TWICE a day. This is your EVERYDAY inhaler --CONTINUE Albuterol as needed for shortness of breath or wheezing. This is your RESCUE inhaler. OK to use prior to exercise --Mucinex as needed

## 2023-03-28 ENCOUNTER — Encounter (HOSPITAL_BASED_OUTPATIENT_CLINIC_OR_DEPARTMENT_OTHER): Payer: Self-pay | Admitting: Pulmonary Disease

## 2023-06-18 ENCOUNTER — Telehealth (HOSPITAL_COMMUNITY): Payer: Self-pay

## 2023-06-18 ENCOUNTER — Ambulatory Visit (HOSPITAL_BASED_OUTPATIENT_CLINIC_OR_DEPARTMENT_OTHER): Payer: 59 | Admitting: Pulmonary Disease

## 2023-06-18 ENCOUNTER — Other Ambulatory Visit (HOSPITAL_COMMUNITY): Payer: Self-pay

## 2023-06-18 ENCOUNTER — Encounter (HOSPITAL_BASED_OUTPATIENT_CLINIC_OR_DEPARTMENT_OTHER): Payer: Self-pay | Admitting: Pulmonary Disease

## 2023-06-18 VITALS — BP 122/76 | HR 83 | Ht 69.0 in | Wt 138.0 lb

## 2023-06-18 DIAGNOSIS — J479 Bronchiectasis, uncomplicated: Secondary | ICD-10-CM

## 2023-06-18 LAB — PULMONARY FUNCTION TEST
DL/VA % pred: 123 %
DL/VA: 5.69 ml/min/mmHg/L
DLCO unc % pred: 130 %
DLCO unc: 37.92 ml/min/mmHg
FEF 25-75 Post: 5.38 L/s
FEF 25-75 Pre: 5 L/s
FEF2575-%Change-Post: 7 %
FEF2575-%Pred-Post: 146 %
FEF2575-%Pred-Pre: 135 %
FEV1-%Change-Post: 5 %
FEV1-%Pred-Post: 104 %
FEV1-%Pred-Pre: 99 %
FEV1-Post: 4.11 L
FEV1-Pre: 3.9 L
FEV1FVC-%Change-Post: 2 %
FEV1FVC-%Pred-Pre: 112 %
FEV6-%Change-Post: 3 %
FEV6-%Pred-Post: 93 %
FEV6-%Pred-Pre: 90 %
FEV6-Post: 4.52 L
FEV6-Pre: 4.38 L
FEV6FVC-%Pred-Post: 102 %
FEV6FVC-%Pred-Pre: 102 %
FVC-%Change-Post: 3 %
FVC-%Pred-Post: 90 %
FVC-%Pred-Pre: 88 %
FVC-Post: 4.52 L
FVC-Pre: 4.38 L
Post FEV1/FVC ratio: 91 %
Post FEV6/FVC ratio: 100 %
Pre FEV1/FVC ratio: 89 %
Pre FEV6/FVC Ratio: 100 %
RV % pred: 140 %
RV: 2.53 L
TLC % pred: 103 %
TLC: 6.87 L

## 2023-06-18 NOTE — Patient Instructions (Signed)
 Mild Bronchiectasis with chronic cough - controlled --CONTINUE Wixela 250-50 ONE puff TWICE a day. This is your EVERYDAY inhaler --CONTINUE Albuterol as needed for shortness of breath or wheezing. This is your RESCUE inhaler --Mucinex as needed

## 2023-06-18 NOTE — Telephone Encounter (Signed)
 Pharmacy Patient Advocate Encounter  Insurance verification completed.    The patient is insured through [employer]. Patient has Medicare and is not eligible for a copay card, but may be able to apply for patient assistance or Medicare RX Payment Plan (Patient Must reach out to their plan, if eligible for payment plan), if available.    Ran test claim for [facility] and the current 30 day co-pay is NOT COVERED.  Ran test claim for Symbicort (Generic) and the current 30 day co-pay is $15.00  Ran test claim for Advair Diskus and the current 30 day co-pay is REFILL TOO SOON.  Ran test claim for [facility] and the current 30 day co-pay is NOT COVERED.  This test claim was processed through [facility]- copay amounts may vary at other pharmacies due to pharmacy/plan contracts, or as the patient moves through the different stages of their insurance plan.

## 2023-06-18 NOTE — Progress Notes (Signed)
 Subjective:   PATIENT ID: Jerry Rivera GENDER: male DOB: [birth year], MRN: [id number]   HPI  Chief Complaint  Patient presents with   Follow-up    Cough    Reason for Visit: Follow-up   [name] is a 43 year old male with HTN who presents for chronic cough.  Synopsis: Five year history of cough that has worsened in the last year. Has been treated with antibiotics, reflux regimen (2 weeks) and allergy regimen (xyzol x 1 month).  2022- Started on Breo with improvement. Switched to Advair due to insurance.  He reports that since starting Breo he noticed that his cough has improved and is less productive. Able to clear his throat without significant sputum production. He rarely uses his albuterol since the maintenance inhaler is more effective. He is able to start jogging. Had to switch to fluticasone-salmeterol which seems to be effective however has only been on it for a week. He did have a period of missing inhaler for a day and has noticed return of sputum production. On his bad days he will need to be more forceful with his coughing. No wheezing.  03/18/23 Patient was lost to follow-up for two years. When he ran out of his medications he developed worsening sputum production. He re-established care in August 2024 and restarted on ICS/LABA. Insurance pays for [employer]. Currently taking it 250-50 mcg 1-2 puffs daily depending on severity of his symptoms. Last had a cold earlier this year and will take up to 2 weeks to recover. Infrequently producing sputum now. This is significantly improved compared to a few years ago.  06/18/23 Since our last visit he has been compliant with Wixela. Denies cough. Occasional sputum production, no wheezing. Still clearing his throat. No exacerbations or respiratory illnesses in the last 3 months.  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 Smokes one cigar a year. Denies vaping. Both parents were smokers in the home and car, using at least a carton a week. No formal diagnosis of asthma in the family.   Past Medical History:  Diagnosis Date   Allergies    Hypertension    Palpitations     No Known Allergies   Outpatient Medications Prior to Visit  Medication Sig Dispense Refill   albuterol (VENTOLIN HFA) 108 (90 Base) MCG/ACT inhaler Inhale 2 puffs into the lungs every 6 (six) hours as needed for wheezing or shortness of breath. 8 g 4   fluticasone-salmeterol (ADVAIR DISKUS) 250-50 MCG/ACT AEPB Inhale 1 puff into the lungs 2 (two) times daily. in the morning and at bedtime. 60 each 5   ibuprofen (ADVIL) 200 MG tablet Take 200 mg by mouth as needed (pain).     Multiple Vitamin (MULTIVITAMIN) tablet Take 1 tablet by mouth daily.     No facility-administered medications prior to visit.    Review of Systems  Constitutional:  Negative for chills, diaphoresis, fever, malaise/fatigue and weight loss.  HENT:  Negative for congestion.   Respiratory:  Negative for cough, hemoptysis, sputum production, shortness of breath and wheezing.   Cardiovascular:  Negative for chest pain, palpitations and leg swelling.     Objective:   Vitals:   06/18/23 1438  BP: 122/76  Pulse: 83  SpO2: 98%  Weight: 138 lb (62.6 kg)  Height: 5\' 9"  (1.753 m)  SpO2: 98 %  Physical Exam: General: Well-appearing, no acute distress HENT: [place], AT Eyes: EOMI, no scleral icterus Respiratory: Clear to auscultation bilaterally.  No crackles, wheezing or rales Cardiovascular: RRR, -M/R/G, no JVD Extremities:-Edema,-tenderness Neuro: AAO x4, CNII-XII grossly intact Psych: Normal mood, normal affect  Data Reviewed:  Imaging: CXR 07/14/20 - Hyperinflation  CT Chest 09/20/20 - Mild bronchiectasis with mucous plugging  PFT: 10/30/20 FVC 4.78 (93%) FEV1 4.09 (99%) Ratio 83  TLC 106% DLCO  98% Interpretation: Normal spirometry. Normal lung volumes. Normal diffusion capacity. No significant bronchodilator response.  06/18/23 FVC 4.52 (90%) FEV1 4.11 (104%) Ratio 91  TLC 103% DLCO 130% Interpretation: Normal pulmonary function tests. Elevated gas exchange suggestive of asthma. Clinically correlate   Labs: CBC    Component Value Date/Time   WBC 5.5 09/03/2021 0842   RBC 5.06 09/03/2021 0842   HGB 15.5 09/03/2021 0842   HCT 46.0 09/03/2021 0842   PLT 202 09/03/2021 0842   MCV 91 09/03/2021 0842   MCH 30.6 09/03/2021 0842   MCHC 33.7 09/03/2021 0842   RDW 12.1 09/03/2021 0842   LYMPHSABS 1.5 09/03/2021 0842   EOSABS 0.1 09/03/2021 0842   BASOSABS 0.0 09/03/2021 0842   Absolute eos 08/09/20 100   Assessment & Plan:   Discussion: 43 year old male with bronchiectasis with chronic bronchitis who presents for follow-up. Prior symptoms included copious sputum production that has largely improved on ICS/LABA. Minimal sputum production on bronchodilators. Discussed clinical course and management of bronchiectasis including bronchodilator regimen (long term use of ICS vs uncontrolled symptoms), preventive care and action plan for exacerbation. Goal is to focus on airway clearance and minimizing respiratory infections.  Mild Bronchiectasis with chronic cough - controlled --Reviewed PFTs. Normal except for elevated DLCO suggestive of asthm --CONTINUE Wixela 250-50 ONE puff TWICE a day. This is your EVERYDAY inhaler --CONTINUE Albuterol as needed for shortness of breath or wheezing. This is your RESCUE inhaler --Mucinex as needed  Health Maintenance Immunization History  Administered Date(s) Administered   Influenza, Seasonal, Injecte, Preservative Fre 03/18/2023   PFIZER(Purple Top)SARS-COV-2 Vaccination 07/16/2019, 08/06/2019, 03/23/2020   Tdap 10/01/2010   CT Lung Screen - not indicated  No orders of the defined types were placed in this encounter.  No orders of the  defined types were placed in this encounter.   Return in about 10 months (around 04/16/2024).  I have spent a total time of 30-minutes on the day of the appointment including chart review, data review, collecting history, coordinating care and discussing medical diagnosis and plan with the patient/family. Past medical history, allergies, medications were reviewed. Pertinent imaging, labs and tests included in this note have been reviewed and interpreted independently by me.  Cambridge Deleo Mechele Collin, MD [facility] 06/18/2023 2:58 PM

## 2023-06-18 NOTE — Progress Notes (Signed)
 PFT completed.

## 2023-10-17 ENCOUNTER — Other Ambulatory Visit: Payer: Self-pay | Admitting: Nurse Practitioner

## 2023-10-17 DIAGNOSIS — J45991 Cough variant asthma: Secondary | ICD-10-CM

## 2023-10-17 DIAGNOSIS — J479 Bronchiectasis, uncomplicated: Secondary | ICD-10-CM

## 2023-10-17 NOTE — Telephone Encounter (Signed)
 Copied from CRM [id number]. Topic: Clinical - Medication Refill >> Oct 17, 2023  9:45 AM Devaughn RAMAN wrote: Medication: fluticasone -salmeterol (ADVAIR DISKUS) 250-50 MCG/ACT AEPB  Has the patient contacted their pharmacy? Yes (Agent: If no, request that the patient contact the pharmacy for the refill. If patient does not wish to contact the pharmacy document the reason why and proceed with request.) (Agent: If yes, when and what did the pharmacy advise?)  This is the patient's preferred pharmacy:  [facility] [id number] - [place], [place] - [address] [place] [address] Phone: [phone] Fax: [phone]  Is this the correct pharmacy for this prescription? Yes If no, delete pharmacy and type the correct one.   Has the prescription been filled recently? No  Is the patient out of the medication? No  Has the patient been seen for an appointment in the last year OR does the patient have an upcoming appointment? Yes  Can we respond through MyChart? Yes  Agent: Please be advised that Rx refills may take up to 3 business days. We ask that you follow-up with your pharmacy.

## 2023-10-20 ENCOUNTER — Other Ambulatory Visit: Payer: Self-pay

## 2023-10-20 MED ORDER — FLUTICASONE-SALMETEROL 250-50 MCG/ACT IN AEPB: 1.0000 | INHALATION_SPRAY | Freq: Two times a day (BID) | RESPIRATORY_TRACT | 5 refills | Status: DC

## 2024-04-06 ENCOUNTER — Encounter (HOSPITAL_BASED_OUTPATIENT_CLINIC_OR_DEPARTMENT_OTHER): Payer: Self-pay | Admitting: Pulmonary Disease

## 2024-04-06 ENCOUNTER — Ambulatory Visit (HOSPITAL_BASED_OUTPATIENT_CLINIC_OR_DEPARTMENT_OTHER): Admitting: Pulmonary Disease

## 2024-04-06 VITALS — BP 138/90 | HR 72 | Temp 98.5°F | Ht 69.0 in | Wt 138.9 lb

## 2024-04-06 DIAGNOSIS — J479 Bronchiectasis, uncomplicated: Secondary | ICD-10-CM

## 2024-04-06 DIAGNOSIS — J45991 Cough variant asthma: Secondary | ICD-10-CM

## 2024-04-06 MED ORDER — FLUTICASONE-SALMETEROL 250-50 MCG/ACT IN AEPB: 1.0000 | INHALATION_SPRAY | Freq: Two times a day (BID) | RESPIRATORY_TRACT | 11 refills | Status: AC

## 2024-04-06 NOTE — Patient Instructions (Signed)
 Mild Bronchiectasis with chronic cough - well controlled --PFTs normal except for elevated DLCO suggestive of asthma --CONTINUE Wixela 250-50 ONE puff TWICE a day. This is your EVERYDAY inhaler --CONTINUE Albuterol  as needed for shortness of breath or wheezing. This is your RESCUE inhaler --Mucinex as needed

## 2024-04-06 NOTE — Progress Notes (Signed)
 Subjective:   PATIENT ID: Jerry Rivera GENDER: male DOB: 18-Jul-1980, MRN: [id number]   HPI  Chief Complaint  Patient presents with   Cough    Reason for Visit: Follow-up   [name] is a 43 year old male with bronchiectasis and HTN who presents for follow-up.  Synopsis: Five year history of cough that has worsened in the last year. Has been treated with antibiotics, reflux regimen (2 weeks) and allergy regimen (xyzol x 1 month).  2022- Started on Breo with improvement. Switched to Advair due to insurance.  He reports that since starting Breo he noticed that his cough has improved and is less productive. Able to clear his throat without significant sputum production. He rarely uses his albuterol  since the maintenance inhaler is more effective. He is able to start jogging. Had to switch to fluticasone -salmeterol which seems to be effective however has only been on it for a week. He did have a period of missing inhaler for a day and has noticed return of sputum production. On his bad days he will need to be more forceful with his coughing. No wheezing.  03/18/23 Patient was lost to follow-up for two years. When he ran out of his medications he developed worsening sputum production. He re-established care in August 2024 and restarted on ICS/LABA. Insurance pays for [employer]. Currently taking it 250-50 mcg 1-2 puffs daily depending on severity of his symptoms. Last had a cold earlier this year and will take up to 2 weeks to recover. Infrequently producing sputum now. This is significantly improved compared to a few years ago.  06/18/23 Since our last visit he has been compliant with Wixela. Denies cough. Occasional sputum production, no wheezing. Still clearing his throat. No exacerbations or respiratory illnesses in the last 3 months.  04/06/24 Since our last visit he reports compliance with Wixela. When off inhaler he has to clear throat more due to mucous production. No  exacerbations since our last visit. No nocturnal awakenings. Will use albuterol  will help prior to exercise especially on humid days.   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 Smokes one cigar a year. Denies vaping. Both parents were smokers in the home and car, using at least a carton a week. No formal diagnosis of asthma in the family.   Past Medical History:  Diagnosis Date   Allergies    Hypertension    Palpitations     No Known Allergies   Outpatient Medications Prior to Visit  Medication Sig Dispense Refill   albuterol  (VENTOLIN  HFA) 108 (90 Base) MCG/ACT inhaler Inhale 2 puffs into the lungs every 6 (six) hours as needed for wheezing or shortness of breath. 8 g 4   Multiple Vitamin (MULTIVITAMIN) tablet Take 1 tablet by mouth daily.     fluticasone -salmeterol (ADVAIR DISKUS) 250-50 MCG/ACT AEPB Inhale 1 puff into the lungs 2 (two) times daily. in the morning and at bedtime. 60 each 5   ibuprofen (ADVIL) 200 MG tablet Take 200 mg by mouth as needed (pain). (Patient not taking: Reported on 04/06/2024)     No facility-administered medications prior to visit.    Review of Systems  Constitutional:  Negative for chills, diaphoresis, fever, malaise/fatigue and weight loss.  HENT:  Negative for congestion.   Respiratory:  Negative for cough, hemoptysis, sputum production, shortness of breath  and wheezing.   Cardiovascular:  Negative for chest pain, palpitations and leg swelling.     Objective:   Vitals:   04/06/24 1427 04/06/24 1504  BP: (!) 147/97 (!) 138/90  Pulse: 72   Temp: 98.5 F (36.9 C)   SpO2: 100%   Weight: 138 lb 14.4 oz (63 kg)   Height: 5' 9 (1.753 m)    SpO2: 100 %  Physical Exam: General: Well-appearing, no acute distress HENT: [place], AT Eyes: EOMI, no scleral icterus Respiratory: Clear to  auscultation bilaterally.  No crackles, wheezing or rales Cardiovascular: RRR, -M/R/G, no JVD Extremities:-Edema,-tenderness Neuro: AAO x4, CNII-XII grossly intact Psych: Normal mood, normal affect  Data Reviewed:  Imaging: CXR 07/14/20 - Hyperinflation  CT Chest 09/20/20 - Mild bronchiectasis with mucous plugging  PFT: 10/30/20 FVC 4.78 (93%) FEV1 4.09 (99%) Ratio 83  TLC 106% DLCO 98% Interpretation: Normal spirometry. Normal lung volumes. Normal diffusion capacity. No significant bronchodilator response.  06/18/23 FVC 4.52 (90%) FEV1 4.11 (104%) Ratio 91  TLC 103% DLCO 130% Interpretation: Normal pulmonary function tests. Elevated gas exchange suggestive of asthma. Clinically correlate   Labs: CBC    Component Value Date/Time   WBC 5.5 09/03/2021 0842   RBC 5.06 09/03/2021 0842   HGB 15.5 09/03/2021 0842   HCT 46.0 09/03/2021 0842   PLT 202 09/03/2021 0842   MCV 91 09/03/2021 0842   MCH 30.6 09/03/2021 0842   MCHC 33.7 09/03/2021 0842   RDW 12.1 09/03/2021 0842   LYMPHSABS 1.5 09/03/2021 0842   EOSABS 0.1 09/03/2021 0842   BASOSABS 0.0 09/03/2021 0842   Absolute eos 08/09/20 100   Assessment & Plan:   Discussion: 43 year old male with bronchiectasis with chronic bronchitis who presents for follow-up. Prior symptoms included copious sputum production that has largely improved on ICS/LABA. Minimal sputum production on bronchodilators. Well controlled on current regimen. Discussed clinical course and management of bronchiectasis including bronchodilator regimen, preventive care and action plan for exacerbation. Goal is to focus on airway clearance and minimizing respiratory infections  Mild Bronchiectasis with chronic cough - well controlled. No exacerbations 2025 --PFTs normal except for elevated DLCO suggestive of asthma --CONTINUE Wixela 250-50 ONE puff TWICE a day. This is your EVERYDAY inhaler --CONTINUE Albuterol  as needed for shortness of breath or wheezing. This is  your RESCUE inhaler --Mucinex as needed --ORDER CT chest without contrast for 09/2023 to ensure stability of bronchiectasis  HTN - asymptomatic --Repeat BP 138/90 --Advised to discuss care with PCP  Health Maintenance Immunization History  Administered Date(s) Administered   Influenza, Seasonal, Injecte, Preservative Fre 03/18/2023   PFIZER(Purple Top)SARS-COV-2 Vaccination 07/16/2019, 08/06/2019, 03/23/2020   Tdap 10/01/2010   CT Lung Screen - not indicated  Orders Placed This Encounter  Procedures   CT Chest Wo Contrast    Standing Status:   Future    Expiration Date:   04/06/2025    Scheduling Instructions:     Schedule in June 2026. Ensure appointment after this    Preferred imaging location?:   [facility]   Meds ordered this encounter  Medications   fluticasone -salmeterol (ADVAIR DISKUS) 250-50 MCG/ACT AEPB    Sig: Inhale 1 puff into the lungs 2 (two) times daily. in the morning and at bedtime.    Dispense:  60 each    Refill:  11    Return in about 6 months (around 10/05/2024). After CT  I have spent a total time of 33-minutes on the day of the appointment including chart review,  data review, collecting history, coordinating care and discussing medical diagnosis and plan with the patient/family. Past medical history, allergies, medications were reviewed. Pertinent imaging, labs and tests included in this note have been reviewed and interpreted independently by me.  Illya Gienger Slater Staff, MD [facility] 04/06/2024 3:05 PM

## 2024-04-10 ENCOUNTER — Ambulatory Visit (HOSPITAL_BASED_OUTPATIENT_CLINIC_OR_DEPARTMENT_OTHER)

## 2024-04-20 ENCOUNTER — Ambulatory Visit (HOSPITAL_BASED_OUTPATIENT_CLINIC_OR_DEPARTMENT_OTHER): Attending: Pulmonary Disease

## 2024-10-05 ENCOUNTER — Ambulatory Visit (HOSPITAL_BASED_OUTPATIENT_CLINIC_OR_DEPARTMENT_OTHER): Admitting: Pulmonary Disease
